# Patient Record
Sex: Female | Born: 1955 | Race: White | Hispanic: No | Marital: Married
Health system: Midwestern US, Community
[De-identification: ages and names within clinical notes are randomized; demographics above are authoritative.]

## PROBLEM LIST (undated history)

## (undated) DIAGNOSIS — K449 Diaphragmatic hernia without obstruction or gangrene: Secondary | ICD-10-CM

## (undated) DIAGNOSIS — N644 Mastodynia: Secondary | ICD-10-CM

## (undated) DIAGNOSIS — M0609 Rheumatoid arthritis without rheumatoid factor, multiple sites: Secondary | ICD-10-CM

## (undated) DIAGNOSIS — M0579 Rheumatoid arthritis with rheumatoid factor of multiple sites without organ or systems involvement: Secondary | ICD-10-CM

## (undated) DIAGNOSIS — C8337 Diffuse large B-cell lymphoma, spleen: Secondary | ICD-10-CM

---

## 2008-11-08 NOTE — ED Provider Notes (Signed)
[facility]                      EMERGENCY DEPARTMENT TREATMENT REPORT   NAME:  [name] [name], [name] [name]                       PT. LOCATION:     ER  [phone]   MR #:         BILLING #: [id number]          DOS: 11/08/2008   TIME: 3:26 P   [id number]   cc:   *Dr. Amie Critchley   PRIMARY CARE PHYSICIAN   Dr. Amie Critchley   EVALUATION TIME   9:06 a.m.   CHIEF COMPLAINT   Hives.   HISTORY OF PRESENT ILLNESS   This is a 53 year old female who states that approximately 45 minutes ago   she was sitting at work when she began to experience an itchy red rash   primarily on her upper extremities, but also beginning on her lower   extremities and torso.  She denies any potential allergen exposure.  Denies   any respiratory difficulties.  Denies any fevers, myalgias, has no other   acute complaints at this time.   REVIEW OF SYSTEMS   CONSTITUTIONAL:  No fevers, no chills.   RESPIRATORY:  No cough, shortness of breath, or wheezing.   MUSCULOSKELETAL:  No joint pain or swelling.   INTEGUMENTARY:  Per HPI.   NEUROLOGICAL:  No headaches, sensory or motor symptoms.   PAST MEDICAL HISTORY   Hyperlipidemia.   PAST SURGICAL HISTORY   Cholecystectomy, hysterectomy, thyroidectomy.   SOCIAL HISTORY   No alcohol, tobacco or drug abuse.   ALLERGIES   ____sulfa and penicillins.   CURRENT MEDICATIONS   Listed and reviewed in [facility].   PHYSICAL EXAMINATION   CONSTITUTIONAL/GENERAL APPEARANCE:  The patient appears well developed and   well nourished.  Appearance and behavior are age and situation appropriate.   VITAL SIGNS:  Blood pressure 143/96, pulse 88, respiratory 20, temperature   98.1, O2 saturation 97% on room air.   INTEGUMENTARY:  The patient presents with multiple urticarial-like wheals   which do blanch to pressure primarily on the upper extremities, but also   beginning to distribute on the lower extremities particularly in the groin   region.  There is no facial involvement. Michele Mcalpine, RN was at bedside    during examination.   RESPIRATORY:  Clear and equal breath sounds.  No respiratory distress,   tachypnea, or accessory muscle use.   CARDIOVASCULAR:  Heart:  Good S1 and S2, regular rate and rhythm.   CONTINUATION BY NICHOLAS BROSKY, PA-C   INITIAL ASSESSMENT AND MANAGEMENT PLAN   This is a 53 year old female who presents with urticaria consistent with   acute allergic reaction with no respiratory difficulties.  At this point,   we will medicate her with Benadryl, Solu-Medrol and Tagamet, and further   evaluate the patient on receipt if these services.   REEVALUATION/COURSE IN ED   The patient had marked improvement of symptoms while in the emergency   department.  At this point, we will discharge the patient home with   supportive care measures and followup care as needed.   CLINICAL IMPRESSION/DIAGNOSIS   Acute urticaria.   DISPOSITION/PLAN   This patient was interviewed, examined, and discussed with Dr. Orma Flaming and it was agreed that the patient could be discharged home in   stable condition.  The patient was given prednisone and Vistaril.  Told to   return to the ER if condition worsens or new symptoms develop.  Follow up   with primary care physician.   Electronically Signed By:   Orma Flaming, M.D. 11/10/2008   10:08   ____________________________   Orma Flaming, M.D.   Dictated by Roderic Scarce, PA-C   My signature above authenticates this document and my orders, the final   diagnosis(es), discharge prescription(s) and instructions in the [facility] record.   GM  D:  11/08/2008  T:  11/09/2008  8:40 A   [id number]

## 2010-03-13 LAB — CBC WITH AUTOMATED DIFF
ABS. BASOPHILS: 0 10*3/uL (ref 0.0–0.06)
ABS. EOSINOPHILS: 0.1 10*3/uL (ref 0.0–0.4)
ABS. LYMPHOCYTES: 1.8 10*3/uL (ref 0.9–3.6)
ABS. MONOCYTES: 0.3 10*3/uL (ref 0.05–1.2)
ABS. NEUTROPHILS: 3.6 10*3/uL (ref 1.8–8.0)
BASOPHILS: 1 % (ref 0–2)
EOSINOPHILS: 1 % (ref 0–5)
HCT: 40.3 % (ref 35.0–45.0)
HGB: 13.8 g/dL (ref 12.0–16.0)
LYMPHOCYTES: 31 % (ref 21–52)
MCH: 31.2 PG (ref 24.0–34.0)
MCHC: 34.2 g/dL (ref 31.0–37.0)
MCV: 91.2 FL (ref 74.0–97.0)
MONOCYTES: 6 % (ref 3–10)
MPV: 12.8 FL — ABNORMAL HIGH (ref 9.2–11.8)
NEUTROPHILS: 61 % (ref 40–73)
PLATELET: 192 10*3/uL (ref 135–420)
RBC: 4.42 M/uL (ref 4.20–5.30)
RDW: 14.2 % (ref 11.6–14.5)
WBC: 5.8 10*3/uL (ref 4.6–13.2)

## 2010-03-13 LAB — METABOLIC PANEL, BASIC
Anion gap: 11 mmol/L (ref 5–15)
BUN/Creatinine ratio: 18 (ref 12–20)
BUN: 16 MG/DL (ref 7–18)
CO2: 26 MMOL/L (ref 21–32)
Calcium: 9 MG/DL (ref 8.4–10.4)
Chloride: 102 MMOL/L (ref 100–108)
Creatinine: 0.9 MG/DL (ref 0.6–1.3)
GFR est AA: >60 mL/min/{1.73_m2} (ref >60)
GFR est non-AA: >60 mL/min/{1.73_m2} (ref >60)
Glucose: 75 MG/DL (ref 74–99)
Potassium: 4.1 MMOL/L (ref 3.5–5.5)
Sodium: 139 MMOL/L (ref 136–145)

## 2010-10-02 NOTE — ED Provider Notes (Signed)
KNOWN ALLERGIES   Codeine Sulfate   Morphine Sulfate: Reaction: Itching   Penicillins       TRIAGE   PATIENT: NAME: [name] [name], AGE: 54, GENDER: female, DOB: Sat         [birth year], TIME OF GREET: Wed Oct 02, 2010 09:08, SSN: [id number],         KG WEIGHT: 113.4 (est.), HEIGHT: 170cm, MEDICAL RECORD NUMBER:         [id number], ACCOUNT NUMBER: [id number], PCP: Cindie Laroche,.   ADMISSION: URGENCY: 2, DEPT: Emergency, BED: WAITING.   VITAL SIGNS: BP 158/99, (Sitting), Pulse 73, Resp 16, Temp 96.6,         (Oral), Pain 10, O2 Sat 98, on Room air, Time 10/02/2010 09:17.   COMPLAINT:  Headache since 1815; B/P 189/112 at home.   PRESENTING COMPLAINT:  pt complains of headache to top of head.         pt states she does not have history of migraines. pt states it         started at approx 1800 yest. she took OTC BC powder, tylenol, and         aleive without relief., Since Yesterday.   PAIN: Patient complains of pain, Pain described as sharp, On a         scale 0-10 patient rates pain as 10, Pain is constant.   TB SCREENING: TB screen negative for this patient.   ABUSE SCREENING: Patient denies physical abuse or threats.   FALL RISK: Fall risk assessment not applicable to this patient.   SUICIDAL IDEATION: Suicidal ideation is not present.   ADVANCE DIRECTIVES: Unknown if patient has advance directives,         Triage assessment performed.   PROVIDERS: TRIAGE NURSE: Gypsy Lore, RN.   PREVIOUS VISIT ALLERGIES: Codeine Sulfate, Penicillins.       CURRENT MEDICATIONS     No recorded medications       MEDICATION SERVICE   DiphenhydrAMINE Hydrochloride:  Order: DiphenhydrAMINE         Hydrochloride (Diphenhydramine Hydrochloride) - Dose: 25 mg         : IV         Ordered by: Nelda Bucks, PA-C         Entered by: Nelda Bucks, PA-C Wed Oct 02, 2010 09:39 ,          Acknowledged by: Kathlen Brunswick, RN Wed Oct 02, 2010 10:20         Documented as given by: Clent Demark, RN Wed Oct 02, 2010 10:40           Patient, Medication, Dose, Route and Time verified prior to         administration.          Time given: 1035, Amount given: 25mg , IV site 1, Medication         administered into right upper arm, IVP, Initial medication, Slowly,         Catheter placement confirmed via flush prior to administration, IV         site without signs or symptoms of infiltration during medication         administration, No swelling during administration, No drainage during         administration, IV flushed after administration, Correct patient,         time, route, dose and medication confirmed prior to administration,         Patient advised of actions and  side-effects prior to administration,         Allergies confirmed and medications reviewed prior to administration,         Patient tolerated procedure well, Patient in position of comfort,         Side rails up, Cart in lowest position, Family at bedside, Call light         in reach.   DiphenhydrAMINE Hydrochloride:  Order: DiphenhydrAMINE         Hydrochloride (Diphenhydramine Hydrochloride) - Dose: 25 mg         : IV         Ordered by: Nelda Bucks, PA-C         Entered by: Nelda Bucks, PA-C Wed Oct 02, 2010 11:11 ,          Acknowledged by: Clent Demark, RN Wed Oct 02, 2010 11:12         Documented as given by: Clent Demark, RN Wed Oct 02, 2010 11:17          Patient, Medication, Dose, Route and Time verified prior to         administration.          Time given: 1117, Amount given: 25mg , IV site 1, Medication         administered into right upper arm, IVP, Subsequent different         medication, Slowly, Catheter placement confirmed via flush prior to         administration, IV site without signs or symptoms of infiltration         during medication administration, No swelling during administration,         No drainage during administration, IV flushed after administration,         Correct patient, time, route, dose and medication confirmed prior to          administration, Patient advised of actions and side-effects prior to         administration, Allergies confirmed and medications reviewed prior to         administration, Patient tolerated procedure well, Patient in position         of comfort, Side rails up, Cart in lowest position, Family at         bedside, Call light in reach.   Fioricet:  Order: Fioricet (Acetaminophen/Butalbital/Caffeine) -         Dose: 1 tab(s) : Oral         Ordered by: Nelda Bucks, PA-C         Entered by: Nelda Bucks, PA-C Wed Oct 02, 2010 15:31 ,          Acknowledged by: Kathlen Brunswick, RN Wed Oct 02, 2010 16:17         Documented as given by: Kathlen Brunswick, RN Wed Oct 02, 2010 16:18          Patient, Medication, Dose, Route and Time verified prior to         administration.          Time given: 1618, Amount given: 1 tab, Site: Medication administered         P.O., Correct patient, time, route, dose and medication confirmed         prior to administration, Patient advised of actions and side-effects         prior to administration, Allergies confirmed and medications reviewed         prior to administration, Patient in position of  comfort, Side rails         up, Cart in lowest position, Family at bedside, Call light in reach.   Haldol:  Order: Haldol (Haloperidol Lactate) - Dose:         2.5 mg : IV         Ordered by: Nelda Bucks, PA-C         Entered by: Nelda Bucks, PA-C Wed Oct 02, 2010 09:39 ,          Acknowledged by: Kathlen Brunswick, RN Wed Oct 02, 2010 10:20         Documented as given by: Clent Demark, RN Wed Oct 02, 2010 10:40          Patient, Medication, Dose, Route and Time verified prior to         administration.          Time given: 1035, Amount given: 2.5mg , IV site 1, Medication         administered into right upper arm, IVP, Initial medication, Slowly,         Catheter placement confirmed via flush prior to administration, IV         site without signs or symptoms of infiltration during medication          administration, No swelling during administration, No drainage during         administration, IV flushed after administration, Correct patient,         time, route, dose and medication confirmed prior to administration,         Patient advised of actions and side-effects prior to administration,         Allergies confirmed and medications reviewed prior to administration,         Patient tolerated procedure well, Patient in position of comfort,         Side rails up, Cart in lowest position, Family at bedside, Call light         in reach.   Nalbuphine Hydrochloride:  Order: Nalbuphine Hydrochloride -         Dose: 5 mg : IV         POTENTIAL ALLERGY REACTION: 'Codeine Sulfate', 'Morphine Sulfate' -         Not a true allergy         Ordered by: Damien Fusi, MD         Entered by: Damien Fusi, MD Wed Oct 02, 2010 13:44          Documented as given by: Clent Demark, RN Wed Oct 02, 2010 13:58          Patient, Medication, Dose, Route and Time verified prior to         administration.          Time given: 1340, Amount given: 5MG , IV site 1, Medication         administered into right upper arm, IVP, Initial medication, Slowly,         Catheter placement confirmed via flush prior to administration, IV         site without signs or symptoms of infiltration during medication         administration, No swelling during administration, No drainage during         administration, IV flushed after administration, Correct patient,         time, route, dose and medication confirmed prior to administration,         Patient advised of  actions and side-effects prior to administration,         Allergies confirmed and medications reviewed prior to administration,         Patient tolerated procedure well, Patient in position of comfort,         Side rails up, Cart in lowest position, Family at bedside, Call light         in reach.   Reglan:  Order: Reglan (Metoclopramide Hydrochloride) -         Dose: 10 mg : IV          POTENTIAL SEVERE INTERACTION: Haldol (Haloperidol Lactate) - Low risk         interaction         Ordered by: Nelda Bucks, PA-C         Entered by: Nelda Bucks, PA-C Wed Oct 02, 2010 11:11 ,          Acknowledged by: Clent Demark, RN Wed Oct 02, 2010 11:12         Documented as given by: Clent Demark, RN Wed Oct 02, 2010 11:18          Patient, Medication, Dose, Route and Time verified prior to         administration.          Time given: 1115, Amount given: 10mg , IV site 1, Medication         administered into right upper arm, IVP, Initial medication, Slowly,         Catheter placement confirmed via flush prior to administration, IV         site without signs or symptoms of infiltration during medication         administration, No swelling during administration, No drainage during         administration, IV flushed after administration, Correct patient,         time, route, dose and medication confirmed prior to administration,         Patient advised of actions and side-effects prior to administration,         Allergies confirmed and medications reviewed prior to administration,         Patient tolerated procedure well, Patient in position of comfort,         Side rails up, Cart in lowest position, Family at bedside, Call light         in reach.   Zofran:  Order: Zofran (Ondansetron Hydrochloride) -         Dose: 4 mg : IV         POTENTIAL SEVERE INTERACTION: Haldol (Haloperidol Lactate) - Not true         allergy         Ordered by: Damien Fusi, MD         Entered by: Damien Fusi, MD Wed Oct 02, 2010 13:09 ,          Acknowledged by: Clent Demark, RN Wed Oct 02, 2010 13:26         Documented as given by: Clent Demark, RN Wed Oct 02, 2010 13:41          Patient, Medication, Dose, Route and Time verified prior to         administration.          Time given: 1340, Amount given: 4MG , IV site 1, Medication         administered into right upper arm, IVP, Initial medication, Slowly,  Catheter placement confirmed via flush prior to administration, IV         site without signs or symptoms of infiltration during medication         administration, No swelling during administration, No drainage during         administration, IV flushed after administration, Correct patient,         time, route, dose and medication confirmed prior to administration,         Patient advised of actions and side-effects prior to administration,         Allergies confirmed and medications reviewed prior to administration,         Patient tolerated procedure well, Patient in position of comfort,         Side rails up, Cart in lowest position, Family at bedside, Call light         in reach.       INSTRUCTION   DISCHARGE:  HEADACHE (CEPHALGIA), ELEVATED BLOOD PRESSURE (HTN,         HYPERTENSION, HIGH BP, ELEVATED BP).   [facility], MEDICINE, [address]         [place], [place] [address], [phone].   SPECIAL:  your blood pressure was elevated today in the emergency         department 158/93 have it rechecked in one week by a primary care         physician         return to ER if symptoms worsen         follow up with primary care physician.   Key:     ALJ=Johnson, PA-C, Adrienne  BMS1=Shortt, RN, Britta Mccreedy  DAP0=Pitrolo, MD,     Onalee Hua     MAK1=Knice, RN, Marcelino Duster

## 2010-10-02 NOTE — ED Provider Notes (Signed)
[facility]   EMERGENCY DEPARTMENT TREATMENT REPORT   NAME:  [name], [name]   SEX:   F   ADMIT: 10/02/2010   DOB:   1956-04-06   MR#    [id number]   ROOM:     TIME SEEN: 01 19 PM   ACCT#  [id number]       cc: Riley Churches MD       CHIEF COMPLAINT:   Headache since 1850,  blood pressure elevated 189/112 at home.       HISTORY OF PRESENT ILLNESS:   A 54 year old female who presents with sudden onset of headache.  She states    that last night while she was driving she had a sudden headache that was a 10    out of 10 to the top of  head and noted that throughout the night, her blood    pressure has been elevated.  She states her blood pressure at its maximum had    been 109/112.  She is taking over-the-counter BC powder, Tylenol and Aleve at    home without relief.  The pain is a 10 out of 10 and constant since yesterday    with no history of trauma.  She denies any urinary or fecal incontinence.     She denies any numbness or tingling anywhere in her person.       REVIEW OF SYSTEMS:   CONSTITUTIONAL:  Denies fever or chills.   EYES:  Denies visual complaints.   ENT:  Denies sore throat.   ENDOCRINE:  Denies increased thirst or urination.   RESPIRATORY:  No cough, shortness of breath, or wheezing.    CARDIOVASCULAR:  Denies chest pain.   GASTROINTESTINAL:  Denies nausea, vomiting.   INTEGUMENTARY:  Denies rashes.   NEUROLOGIC:  As above.  Denies sensory or motor deficits.       PAST MEDICAL HISTORY:   Hyperlipidemia, hysterectomy, thyroidectomy, cholecystectomy.       SOCIAL HISTORY:   Denies tobacco, alcohol or recreational drug use.       FAMILY HISTORY:   Noncontributory.       CURRENT MEDICATIONS:   None.       ALLERGIES:   CODEINE, MORPHINE, PENICILLIN.       PHYSICAL EXAMINATION:   VITAL SIGNS:  Blood pressure 158/99, pulse 73, respirations 16, temperature    96.6, pain 10, O2 sat 98% on room air.   GENERAL:  This is a well-developed, well-nourished, nonacute appearing female     who is in no obvious distress.   HEENT:  Conjunctivae clear.  Hearing is grossly intact to voice.  Eyes:     Extraocular muscles are intact.  No nystagmus.  Ears/Nose:  Hearing is grossly    intact to voice.  Internal and external examinations of the ears and nose are    unremarkable.  Mouth/Throat:  Surfaces of the pharynx, palate, and tongue are    pink, moist, and without lesions.    RESPIRATORY:  Clear and equal breath sounds.  No respiratory distress,    tachypnea, or accessory muscle use.    CARDIOVASCULAR:  Heart regular, without murmurs, gallops, rubs, or thrills.     PMI not displaced.    CHEST:  Chest symmetrical without masses or tenderness.   No peripheral edema or significant varicosities.    GI:  Abdomen soft, nontender, without complaint of pain to palpation.  No    hepatomegaly or splenomegaly.    MUSCULOSKELETAL:  Stance and gait appear  normal.    SKIN:  Warm and dry without rashes.    NEUROLOGIC:  Alert, oriented. Sensation intact, motor strength equal and    symmetric.  No facial asymmetry.  Gross check is 5 out of 5.  The patient has    intact sensation bilateral sides of body and it appears that this feels the    same.  Finger-to-nose testing within normal limits.  Normal reflexes of 2+.       INITIAL ASSESSMENT AND MANAGEMENT PLAN:   This is a 54 year old female who presents with sudden onset headache 1800    hours yesterday evening along with elevated blood pressures.  No history    hypertension.  I will CT the head to rule out any intracranial abnormalities    coupled with possible LP to rule out a subarachnoid bleed with this being the    worst headache.  I will give her IV pain medication.       PROCEDURE NOTE:     After obtaining informed consent under sterile conditions, the area over L3-L4    prepped, anesthetized with plain lidocaine,  spinal needle inserted into disk    space L3-L4, no fluid was obtained.  I did readjust the needle, once again     the needle hub was up to skin with no production of CSF.  Will attempt to do    fluoroscopy guided CSF fluid obtaining.       CONTINUATION BY ADRIENNE Laural Benes, PA-C:       RESULTS OF DIAGNOSTIC STUDIES:   CT read as normal with no acute abnormalities by Dr. Noel Gerold.  CBC, PT, PTT and    BMP within normal limits.  The patient did undergo a lumbar puncture by    myself.  Please see my procedure note; however, was unsuccessful.  She did    follow up with Dr. Scarlette Shorts who did perform successful lumbar    puncture L4-L5 level as described in his note for fluoroscopy guided spine    injection.  Informed consent was obtained, back was marked at L4-L5 level,    skin was prepped with antiseptic, Xylocaine used for anesthesia.  A 22 gauge    needle was inserted and CSF was obtained which was initial slightly bloody but    cleared quickly.  The cerebrospinal fluid was placed in 4 tubes.  The stylet    was returned.  The needle was removed.  The patient tolerated the procedure    well without any immediate problems.  Fluid was sent for appropriate studies,    CSF cell count tube one was 374 red blood cells that did clear by tube 3    which was 5.  There was no increased glucose, protein.  White blood cell count    was 0.       COURSE IN THE EMERGENCY DEPARTMENT:   The patient given IV migraine cocktails followed by Nubain and Zofran and had    improvement of symptoms.       FINAL DIAGNOSIS:   Acute cephalgia.       DISPOSITION:   The patient discharged stable to home.  She is given Fioricet prescription.     She did have elevated blood pressure, was counseled on this and written the    blood pressure at today's visit.  She is to follow up with primary care.     Return to the ED if symptoms persist or worsen.  Above patient was evaluated  by myself and by Dr. Damien Fusi who does agree.           ___________________   Elsie Saas MD   Dictated By: Shireen Quan, PA-C   hp   D:10/02/2010    T: 10/02/2010 13:51:47   [id number]

## 2010-10-11 NOTE — ED Provider Notes (Signed)
KNOWN ALLERGIES   Codeine Sulfate   Morphine Sulfate: Reaction: Itching   Penicillins       TRIAGE   PATIENT: NAME: [name] [name], AGE: 55, GENDER: female, DOB:         Sat [birth year], TIME OF GREET: Fri Oct 11, 2010 10:30, SSN:         [id number], KG WEIGHT: 113.4 (est.), HEIGHT: 170cm, MEDICAL RECORD         NUMBER: [id number], ACCOUNT NUMBER: [id number], PCP: Cindie Laroche,.   ADMISSION: URGENCY: 2, AMBULANCE: [facility] #, DEPT: Emergency,         BED: 2ED 13CP.   VITAL SIGNS: BP 139/104, (Lying), Pulse 74, Resp 16, Temp 97.9,         (Oral), Pain 10, O2 Sat 96, on Room air, Time 10/11/2010 10:46.   COMPLAINT:  Ha - blurry vision HTN.   PRESENTING COMPLAINT:  Constant H/A since 12/28 - intermittently         worse, dizzy when walking the last few day, +light sensitivity, N/V         several times in last week. Intermittent noise sensitivity.   PAIN: Patient complains of pain, Pain described as throbbing, On         a scale 0-10 patient rates pain as 10, pounding, Pain is constant.   IMMUNIZATIONS:  Last tetanus shot received less than 10 years         ago, pnuemonia shot up to date, flu shot up to date.   LMP: LMP: Hysterectomy.   TB SCREENING: TB screen negative for this patient.   ABUSE SCREENING: Patient denies physical abuse or threats.   FALL RISK: Patient has a high risk of falling, Patient has a         history of falling (25), Secondary diagnosis (25), None/bed         rest/nurse assist (0), No IV or IV access (0), Weak (10), Oriented to         own ability (0), Total 60, last afll 3 weeks ago.   SUICIDAL IDEATION: Suicidal ideation is not present.   ADVANCE DIRECTIVES: Patient does not have advance directives,         Triage assessment performed.   PROVIDERS: TRIAGE NURSE: Gypsy Lore, RN.   PREVIOUS VISIT ALLERGIES: Codeine Sulfate, Morphine Sulfate,         Penicillins.       AMBULANCE   AMBULANCE: Ambulance: Fri Oct 11, 2010 09:59.       CURRENT MEDICATIONS    Zofran ODT:  1 tab Oral As needed every four hours. x 4 Mg - Oral         - Q4PRN.   Fioricet:  1-2 tabs Oral See Notes. x 325 Mg-50 Mg-40 Mg - Oral -         SEENOTES.   Synthroid:  150 mcg Oral once a day.   Crestor:  10 mg Oral once a day.   Mirapex:  2 mg Oral once a day.   goodies - last dose this AM       MEDICATION SERVICE   Ativan:  Order: Ativan (Lorazepam) - Dose: 2 mg : Oral         Ordered by: Vinnie Langton, MD         Entered by: Vinnie Langton, MD Caleen Essex Oct 11, 2010 11:49 ,          Acknowledged by: Britta Mccreedy  Lessie Dings, RN Caleen Essex Oct 11, 2010 11:56         Documented as given by: Gypsy Lore, RN Caleen Essex Oct 11, 2010 12:05          Patient, Medication, Dose, Route and Time verified prior to         administration.          Time given: 1207, Amount given: 2mg , Site: Medication administered         P.O., Correct patient, time, route, dose and medication confirmed         prior to administration, Patient advised of actions and side-effects         prior to administration, Allergies confirmed and medications reviewed         prior to administration, Patient in position of comfort, Side rails         up, Cart in lowest position, Family at bedside, Call light in reach.   DiphenhydrAMINE Hydrochloride:  Order: DiphenhydrAMINE         Hydrochloride (Diphenhydramine Hydrochloride) - Dose: 25 mg         : IV         Ordered by: Vinnie Langton, MD         Entered by: Vinnie Langton, MD Caleen Essex Oct 11, 2010 11:49 ,          Acknowledged by: Gypsy Lore, RN Caleen Essex Oct 11, 2010 11:56         Documented as given by: Gypsy Lore, RN Caleen Essex Oct 11, 2010 12:07          Patient, Medication, Dose, Route and Time verified prior to         administration.          Time given: 1204, Amount given: 25mg , IV site 1, Concentration         confirmed prior to administration, IVP, Initial medication, Slowly,         Subsequent different medication, Slowly, Catheter placement confirmed         via flush prior to administration, IV site without signs or symptoms          of infiltration during medication administration, No swelling during         administration, No drainage during administration, IV flushed after         administration, Correct patient, time, route, dose and medication         confirmed prior to administration, Patient advised of actions and         side-effects prior to administration, Allergies confirmed and         medications reviewed prior to administration, Patient in position of         comfort, Side rails up, Cart in lowest position, Family at bedside,         Call light in reach.   Reglan:  Order: Reglan (Metoclopramide Hydrochloride) -         Dose: 10 mg : IV         Ordered by: Vinnie Langton, MD         Entered by: Vinnie Langton, MD Caleen Essex Oct 11, 2010 12:00          Documented as given by: Gypsy Lore, RN Caleen Essex Oct 11, 2010 12:08          Patient, Medication, Dose, Route and Time verified prior to         administration.          Time given:  1207, Amount given: 10mg  in 10 ml over 2 min, IV site 1,         Medication administered into left forearm, Concentration confirmed         prior to administration, IVP, Initial medication, Slowly, Subsequent         different medication, Slowly, Catheter placement confirmed via flush         prior to administration, IV site without signs or symptoms of         infiltration during medication administration, No swelling during         administration, No drainage during administration, IV flushed after         administration, Correct patient, time, route, dose and medication         confirmed prior to administration, Patient advised of actions and         side-effects prior to administration, Allergies confirmed and         medications reviewed prior to administration, Patient in position of         comfort, Side rails up, Cart in lowest position, Family at bedside,         Call light in reach.   (CANCELLED) Prochlorperazine Edisylate:  Order: Prochlorperazine         Edisylate - Dose: 10 mg : IV          Ordered by: Vinnie Langton, MD         Entered by: Vinnie Langton, MD Caleen Essex Oct 11, 2010 11:49 ,          Acknowledged by: Gypsy Lore, RN Caleen Essex Oct 11, 2010 11:56         Cancelled by: Vinnie Langton, MD. Caleen Essex Oct 11, 2010 11:59          Cancel reason: Change in medication plan.       INSTRUCTION   DISCHARGE:  HEADACHE, CLUSTER.   FOLLOWUPMeridee Score, [address],         [place] [address], [phone].   SPECIAL:  Follow up with neurologist Dr. Rosana Hoes.         Return to ER for any worsening or new concerns.         Do not drive or operate machinery while medicated with Vicodin and/or         Reglan.   Key:     [facility]=Conrad, PA-C, Angela  BMS1=Shortt, RN, Britta Mccreedy  CNL2=Lamb, RN,     Delight Ovens, MD, Rolm Gala

## 2010-10-11 NOTE — ED Provider Notes (Signed)
KNOWN ALLERGIES   Codeine Sulfate   Morphine Sulfate: Reaction: Itching   Penicillins       TRIAGE Caleen Essex Oct 11, 2010 10:48 BMS1)   PATIENT: NAME: [name] [name], AGE: 55, GENDER: female, DOB:         Sat 1956/08/10, TIME OF GREET: Fri Oct 11, 2010 10:30, [place]:         [id number], KG WEIGHT: 113.4 (est.), HEIGHT: 170cm, MEDICAL RECORD         NUMBER: [id number], ACCOUNT NUMBER: [id number], PCP: Cindie Laroche,. Caleen Essex Oct 11, 2010 10:48 BMS1)   ADMISSION: URGENCY: 2, AMBULANCE: [facility] #, DEPT: Emergency,         BED: 2ED 13CP. Caleen Essex Oct 11, 2010 10:48 BMS1)   VITAL SIGNS: BP 139/104, (Lying), Pulse 74, Resp 16, Temp 97.9,         (Oral), Pain 10, O2 Sat 96, on Room air, Time 10/11/2010 10:46. (10:46         BMS1)   COMPLAINT:  Ha - blurry vision HTN. (Fri Oct 11, 2010 10:48         BMS1)   PRESENTING COMPLAINT:  Constant H/A since 12/28 - intermittently         worse, dizzy when walking the last few day, +light sensitivity, N/V         several times in last week. Intermittent noise sensitivity. (10:57         BMS1)   PAIN: Patient complains of pain, Pain described as throbbing, On         a scale 0-10 patient rates pain as 10, pounding, Pain is constant.         (10:57 BMS1)   IMMUNIZATIONS:  Last tetanus shot received less than 10 years         ago, pnuemonia shot up to date, flu shot up to date. (10:57         BMS1)   LMP: LMP: Hysterectomy. (10:57 BMS1)   TB SCREENING: TB screen negative for this patient. (10:57         BMS1)   ABUSE SCREENING: Patient denies physical abuse or threats. (10:57         BMS1)   FALL RISK: Patient has a high risk of falling, Patient has a         history of falling (25), Secondary diagnosis (25), None/bed         rest/nurse assist (0), No IV or IV access (0), Weak (10), Oriented to         own ability (0), Total 60, last afll 3 weeks ago. (10:57 BMS1)   SUICIDAL IDEATION: Suicidal ideation is not present. (10:57         BMS1)    ADVANCE DIRECTIVES: Patient does not have advance directives,         Triage assessment performed. (10:57 BMS1)   PROVIDERS: TRIAGE NURSE: Gypsy Lore, RN. Caleen Essex Oct 11, 2010         10:48 BMS1)   PREVIOUS VISIT ALLERGIES: Codeine Sulfate, Morphine Sulfate,         Penicillins. Caleen Essex Oct 11, 2010 10:48 BMS1)       AMBULANCE (09:59 CNL2)   AMBULANCE: Ambulance: Caleen Essex Oct 11, 2010 09:59.       CURRENT MEDICATIONS   Zofran ODT:  1 tab Oral As needed every four hours. x 4 Mg - Oral         -  Q4PRN. (10:48 BMS1)   Fioricet:  1-2 tabs Oral See Notes. x 325 Mg-50 Mg-40 Mg - Oral -         SEENOTES. (10:48 BMS1)   Synthroid:  150 mcg Oral once a day. (10:48 BMS1)   Crestor:  10 mg Oral once a day. (10:48 BMS1)   Mirapex:  2 mg Oral once a day. (10:49 BMS1)   goodies - last dose this AM (11:07 BMS1)       MEDICATION SERVICE   Ativan:  Order: Ativan (Lorazepam) - Dose: 2 mg : Oral         Ordered by: Vinnie Langton, MD         Entered by: Vinnie Langton, MD Caleen Essex Oct 11, 2010 11:49 ,          Acknowledged by: Gypsy Lore, RN Caleen Essex Oct 11, 2010 11:56         Documented as given by: Gypsy Lore, RN Caleen Essex Oct 11, 2010 12:05          Patient, Medication, Dose, Route and Time verified prior to         administration.          Time given: 1207, Amount given: 2mg , Site: Medication administered         P.O., Correct patient, time, route, dose and medication confirmed         prior to administration, Patient advised of actions and side-effects         prior to administration, Allergies confirmed and medications reviewed         prior to administration, Patient in position of comfort, Side rails         up, Cart in lowest position, Family at bedside, Call light in reach.   DiphenhydrAMINE Hydrochloride:  Order: DiphenhydrAMINE         Hydrochloride (Diphenhydramine Hydrochloride) - Dose: 25 mg         : IV         Ordered by: Vinnie Langton, MD         Entered by: Vinnie Langton, MD Caleen Essex Oct 11, 2010 11:49 ,           Acknowledged by: Gypsy Lore, RN Caleen Essex Oct 11, 2010 11:56         Documented as given by: Gypsy Lore, RN Caleen Essex Oct 11, 2010 12:07          Patient, Medication, Dose, Route and Time verified prior to         administration.          Time given: 1204, Amount given: 25mg , IV site 1, Concentration         confirmed prior to administration, IVP, Initial medication, Slowly,         Subsequent different medication, Slowly, Catheter placement confirmed         via flush prior to administration, IV site without signs or symptoms         of infiltration during medication administration, No swelling during         administration, No drainage during administration, IV flushed after         administration, Correct patient, time, route, dose and medication         confirmed prior to administration, Patient advised of actions and         side-effects prior to administration, Allergies confirmed and         medications reviewed prior to administration, Patient in position of  comfort, Side rails up, Cart in lowest position, Family at bedside,         Call light in reach.   Reglan:  Order: Reglan (Metoclopramide Hydrochloride) -         Dose: 10 mg : IV         Ordered by: Vinnie Langton, MD         Entered by: Vinnie Langton, MD Caleen Essex Oct 11, 2010 12:00          Documented as given by: Gypsy Lore, RN Caleen Essex Oct 11, 2010 12:08          Patient, Medication, Dose, Route and Time verified prior to         administration.          Time given: 1207, Amount given: 10mg  in 10 ml over 2 min, IV site 1,         Medication administered into left forearm, Concentration confirmed         prior to administration, IVP, Initial medication, Slowly, Subsequent         different medication, Slowly, Catheter placement confirmed via flush         prior to administration, IV site without signs or symptoms of         infiltration during medication administration, No swelling during          administration, No drainage during administration, IV flushed after         administration, Correct patient, time, route, dose and medication         confirmed prior to administration, Patient advised of actions and         side-effects prior to administration, Allergies confirmed and         medications reviewed prior to administration, Patient in position of         comfort, Side rails up, Cart in lowest position, Family at bedside,         Call light in reach.   (CANCELLED) Prochlorperazine Edisylate:  Order: Prochlorperazine         Edisylate - Dose: 10 mg : IV         Ordered by: Vinnie Langton, MD         Entered by: Vinnie Langton, MD Caleen Essex Oct 11, 2010 11:49 ,          Acknowledged by: Gypsy Lore, RN Caleen Essex Oct 11, 2010 11:56         Cancelled by: Vinnie Langton, MD. Caleen Essex Oct 11, 2010 11:59          Cancel reason: Change in medication plan.       ORDERS   PICC Team for IV placement and labs:  Ordered for: Arvella Merles, MD, Rolm Gala         Status: Done by Sharolyn Douglas Fri Oct 11, 2010 11:04. (11:03         BMS1)   O2 NRB Mask:  Ordered for: Arvella Merles, MD, Rolm Gala         Status: Done by Pernell Dupre, PM, Delice Bison Oct 11, 2010 11:14. (11:11         [facility])   Visual Acuity Exam:  Ordered for: Arvella Merles, MD, Rolm Gala         Status: Done by Lessie Dings, RN, Boykin Peek Oct 11, 2010 11:49. (11:22         [facility])   IV- Normal Saline 1 liter Bolus:  Ordered for: Arvella Merles, MD, Rolm Gala  Status: Cancelled by Arvella Merles, MD, Lonia Farber Oct 11, 2010 11:33. (11:32         ZOX0)   PO Fluid [place] 32 OZ:  Ordered for: Arvella Merles, MD, Rolm Gala         Status: Done by Pernell Dupre, PM, Delice Bison Oct 11, 2010 13:08. (11:34         EHK1)   BP Cuff Adult Regular:  Ordered for: Arvella Merles, MD, Rolm Gala         Status: Active. (13:35 BMS1)   Elita Boone IV Cath:  Ordered for: Arvella Merles, MD, Rolm Gala         Status: Active. (13:35 BMS1)   Non-Rebreather Adult:  Ordered for: Arvella Merles, MD, Rolm Gala         Status: Active. (13:35 BMS1)   Elita Boone IV Cath:  Ordered for: Arvella Merles, MD, Rolm Gala         Status: Active. (13:35 BMS1)    Nasal Cannula:  Ordered for: Arvella Merles, MD, Rolm Gala         Status: Active. (13:35 BMS1)   Elita Boone IV Cath:  Ordered for: Arvella Merles, MD, Rolm Gala         Status: Active. (13:35 BMS1)   ER OXYGEN THERAPY:  Ordered for: Arvella Merles, MD, Rolm Gala         Status: Active. (13:35 BMS1)   Dial A Flow Tubing:  Ordered for: Arvella Merles, MD, Rolm Gala         Status: Active. (13:35 BMS1)   Elita Boone IV Cath:  Ordered for: Arvella Merles, MD, Rolm Gala         Status: Active. (13:35 BMS1)      Ordered for: Arvella Merles, MD, Rolm Gala         Status: Active. (13:35 BMS1)   CONTINUOUS PULSE OX:  Ordered for: Arvella Merles, MD, Rolm Gala         Status: Active. (13:35 BMS1)   IV SET-UP:  Ordered for: Arvella Merles, MD, Rolm Gala         Status: Active. (13:35 BMS1)       NURSING ASSESSMENT: HEADACHE (10:57 BMS1)   CONSTITUTIONAL: Patient arrives ambulatory, Gait steady, Patient         appears comfortable, Patient cooperative, Patient alert, Oriented to         person, place and time, Skin warm, Skin dry, Skin normal in color,         Mucous membranes pink, Mucous membranes moist, Patient is         well-groomed, Patient complains of Constant H/A since 12/28 -         intermittently worse, dizzy when walking the last few day, +light         sensitivity, N/V several times in last week. Intermittent noise         sensitivity.   PAIN: on a scale 0-10 patient rates pain as 10.   HEADACHE: history of migraines, no associated aura, Associated         with difficulty concentrating, not associated with diplopia,         Associated with nausea, Associated with vomiting, Associated with         photophobia, Associated with phonophobia, Notes: 1st ever headache         last week - Seen here - CTS and LP's x 2 done for eval. Blurry         vision.   NEURO: Neuro assessment findings include onset of symptoms: last         week, Pupils equally round and reactive to light, Left  pupil 3 mm in         size, Right pupil 3 mm in size, Able to close eyes, Face symmetrical,         Speech normal, no ptosis, no nystagmus, Visual changes described  as,         blurred vision, no facial droop, no facial numbness, no swelling, no         paresthesias, GCS:, Eye opening: (4) - Spontaneous, Verbal: (5) -         Oriented/conversive, Motor: (6) - Obeys commands/Spontaneous, Hand         grasps equal, Upper extremity strength strong, no numbness to upper         extremities, Lower extremity strength strong, Foot press equal, no         numbness to lower extremities, Associated with dizziness described         as, feeling unsteady, no associated fever, no associated memory loss,         no associated loss of consciousness, Associated with motor ability         changes:, falling a lot, no associated neck stiffness, Associated         with nausea, no associated alterations in sensation, no associated         personality changes, no associated posturing, no associated seizures,         no associated syncopal episode, Associated with vomiting, history,         Number of times: several times last week, no associated weakness.   ENT: Ear assessment findings include ear normal to inspection,         Nasal assessment findings include nose normal to inspection, Mouth         and throat assessment findings include mouth inspection normal, no         associated headache.       NURSING PROCEDURE: DISCHARGE NOTE (13:28 BMS1)   DISCHARGE: Patient discharged to home, ambulating without         assistance, family driving, accompanied by husband/wife/partner,         Discharge instructions given to patient, IV discontinued at 1330, IV         Fluids started at 1115, IV Fluids discontinued at 1330, Prescriptions         given and instructions on side effects given, Name of prescription(s)         given: Reglan, vicodin, Above person(s) verbalized understanding of         discharge instructions and follow-up care, Patient treated and         evaluated by physician.   BELONGINGS: Belongings and valuables with patient at time of          discharge include:, Belongings remain with patient, Valuables remain         with patient.   VITAL SIGNS: BP: 142, / 99, BP: (Sitting), Pulse: 60, Resp: 16,         Pain: 1-2, O2 sat: 100, on Room air.       NURSING PROCEDURE: EYE CARE (11:49 BMS1)   PATIENT IDENTIFIER: Patient's identity verified by patient         stating name, Patient's identity verified by patient stating birth         date, Patient's identity verified by hospital ID bracelet.   EYE CARE: Eye care indicated for visual changes.   FOLLOW-UP: After procedure, visual acuity, left eye: 20/200,  right eye: 20/200, both eyes: 20/200, pt wears glasses - not w/ pt at         this time.       NURSING PROCEDURE: IV (11:15 BMS1)   PATIENT IDENITIFIER: Patient's identity verified by patient         stating name, Patient's identity verified by patient stating birth         date, Patient's identity verified by hospital ID bracelet.   IV SITE 1: IV established, to the left forearm, using a 20 gauge         catheter, in five attempts, Saline lock established, Flushed with         normal saline (mls): 10, Labs drawn at time of placement, Specimens         labeled in the presence of the patient, Tourniquet removed from         patient after procedure., Notes: 2 sticks by B. Shortt, 2 sticks by         Kathlene November; 1 by PICC team.   FOLLOW-UP SITE 1: After procedure, sterile transparent dressing         applied, After procedure, IV line connections checked and properly         labeled, After procedure, no drainage at IV site, After procedure, no         swelling at IV site, After procedure, no redness at IV site, IV         Fluids started at 1115.   FLUIDS: 0.9 normal saline 1 liter hung, first bag, IV bolus of         1000 ml established, at a rate of 1000 ml per hour, via Dial-a-flow         tubing.       NURSING PROCEDURE: NURSE NOTES (11:17 BMS1)   NURSES NOTES: Notes: O2 placed - NRB.       DIAGNOSIS (13:25 EHK1)    FINAL: PRIMARY: Cluster headache, ADDITIONAL: Dehydration         (hypovolemia).       DISPOSITION   PATIENT:  Disposition Type: Discharged, Disposition: Discharged,         Disposition Transport: Family/Friend drive, Condition: Stable. (13:25         EHK1)      IV Infusion: Start and stop time entered for IV fluid, Patient left the         department. (13:34 BMS1)       INSTRUCTION (13:02 AMC)   DISCHARGE:  HEADACHE, CLUSTER.   FOLLOWUPMeridee Score, [address],         [place] [address], [phone].   SPECIAL:  Follow up with neurologist Dr. Rosana Hoes.         Return to ER for any worsening or new concerns.         Do not drive or operate machinery while medicated with Vicodin and/or         Reglan.   Key:     [facility]=Conrad, PA-C, Angela  BMS1=Shortt, RN, Britta Mccreedy  CNL2=Lamb, RN,     Delight Ovens, MD, Rolm Gala

## 2010-10-11 NOTE — ED Provider Notes (Signed)
[facility]   EMERGENCY DEPARTMENT TREATMENT REPORT   NAME:  MIKESKA, Brynlynn   SEX:   F   ADMIT: 10/11/2010   DOB:   1955-12-18   MR#    [id number]   ROOM:     TIME SEEN: 11 46 AM   ACCT#  [id number]       cc: Riley Churches MD       PRIMARY CARE PHYSICIAN:   Dr. Eliezer Champagne       CHIEF COMPLAINT:   Headache, blurred vision, hypertension.       HISTORY OF PRESENT ILLNESS:   This is a 55 year old female who states that she was seen here a little over a    week ago for similar symptoms and had a CAT scan and lumbar puncture and was    discharged home with a diagnosis of acute cephalgia and discharged with    Fioricet and Zofran.  She states the medications did not really help her.  She    saw Dr. Larrie Kass partner and was prescribed Imdur and Imitrex.  She    states that Imitrex made her feel even worse.  She takes Goody powders at home    which seem to dull her symptoms.  She has had a persistent headache which she    describes as a top of her head pounding sensation.  It lasts anywhere from 15    minutes at a time to hours at a time and usually resolves on its own.  She    does have some photophobia with it.  This morning, she developed some blurred    vision which is bilateral.  She does wear glasses but it seems like her    vision is worse.  She also feels dizzy.  She  states that when she sits up or    stands up, she just feels "nauseous" and that is what she describes as    dizziness.  She has had some nausea and occasional vomiting over the last    couple of days.  After discharge from the ER, she never had relief but just    intermittent pain.  She states the headaches are gradual and come and go.     Today, her headache has been more constant.  As I am evaluating her, her blood    pressure seemed to come down to normal, and she now states her pain is 4 to 5    out of 10 and improving on its own, although she did have some Goody powder      a couple of hours ago at home.  The patient states that she was diagnosed    with cluster headaches by her primary care's associate and was given Imitrex    and Imdur which have not helped.         REVIEW OF SYSTEMS:   CONSTITUTIONAL:  The patient denies any fever or chills.     EYES:  Positive for photophobia.   ENT:  The patient denies any congestion or URI symptoms.   RESPIRATORY:  No cough.   CARDIOVASCULAR:  Denies chest pain.   GASTROINTESTINAL:  Nausea and vomiting intermittently.   NEUROLOGIC:  Positive for headache.   MUSCULOSKELETAL:  No joint pain or swelling.    INTEGUMENTARY:  No rashes.    PSYCHIATRIC:  No suicidal or homicidal ideation.        MEDICATIONS:   Zofran, Fioricet, Synthroid, Crestor, Mirapex,  Goody powders, Imitrex and  Imdur.       ALLERGIES:   CODEINE, MORPHINE, PENICILLIN.       PAST MEDICAL HISTORY:   Diabetes, diet controlled, hypothyroidism, cluster headaches, hyperlipidemia,    restless leg syndrome.       SOCIAL HISTORY:   One to 2 drinks of alcohol a week.  Nonsmoker.       FAMILY HISTORY:   Noncontributory.       PHYSICAL EXAMINATION:    VITAL SIGNS:  Blood pressure 124/79, pulse 72, respirations 18, temperature    97.9, pulse oximetry 96% on room air and pain at presentation was 10 out of    10.  As I talked to her, she started to improve and it is now 4 to 5 out of    10.   HEENT:  Eyes are nonicteric.  Pupils are equal, round and reactive to light    bilaterally. ENT:  Mucous membranes moist and pink.     RESPIRATORY:  Clear and equal breath sounds.  No respiratory distress,    tachypnea, or accessory muscle use.    CARDIOVASCULAR:  Heart regular, without murmurs, gallops, rubs, or thrills.     PMI not displaced.    GI:  Abdomen soft, nontender, without complaint of pain to palpation.  No    hepatomegaly or splenomegaly.      MUSCULOSKELETAL:  Nails:  No clubbing or deformities.  Nail beds pink with    prompt capillary refill.    SKIN:  Warm and dry without rashes.     PSYCHIATRIC:  Oriented to time, place and person.  Mood and affect    appropriate.    NEUROLOGICAL:  The patient has 5 out of 5 strength to all extremities.  No    facial asymmetry or slurred speech. Cerebellar testing, finger-to-nose is    unremarkable. He does have noted photophobia on exam.       INITIAL ASSESSMENT:   A 55 year old with complaints of persistent headache for the past 9 days.  The    headache is constant since this morning but has been intermittent, lasting    minutes to hours.       DIAGNOSIS:   Cluster headaches.       Out of medications at home, Goody powder has been the only thing that has    dulled her symptoms.  We will go ahead and try 100% oxygen by nonrebreather    mask and see if this improves her symptoms that seem more consistent with    cluster headache.  If that does not improve, we will medicate her.       CONTINUATION BY ANGELA CONRAD, PA-C:         EMERGENCY DEPARTMENT COURSE:         The patient was given initially O2 on a nonrebreather, which did not help her    symptoms.  She was then given oral Ativan IV, Benadryl and Reglan and at    recheck, she did improve and said her pain was coming down, it was 4 out of 10    and she agrees on the plan with discharged, follow up with neuro and Reglan    as discussed with Dr. Arvella Merles.       DIAGNOSES:   1.  Acute cephalgia, cluster headache,    2.  Dehydration.       DISPOSITION AND PLAN:      The patient discharged with followup with Dr. Rosana Hoes.  Return for worsening or  new concerns.  Given Reglan and Vicodin for breakthrough headaches.  The    patient was personally evaluated by myself and Dr. Vinnie Langton who agrees with    the above assessment and plan.           ___________________   Smitty Cords MD   Dictated By: Wynona Luna. Renata Caprice, PA-C   lo   D:10/11/2010   T: 10/11/2010 12:35:23   [id number]

## 2010-10-16 NOTE — Procedures (Signed)
[facility]   Electroencephalogram   NAME:  [name], [name]    DATE: 10/16/2010   EEG#:    DOB: 11-Jul-1956   MR#    [id number]   ROOM:  CATH   ACCT#  [id number]   SEX: F   REFERRING PHYSICIAN: NADER Antionette Char           cc: Riley Churches MD       CLINICAL HISTORY:   This is a 55 year old female who has been having bad headache episodes for the    last 2 weeks.       This is a 16-channel EEG performed on a 55 year old female who was described    as awake, drowsy and asleep during the recording following her lumbar puncture    procedures.       Background during wakefulness is characterized by 8.5 to 9 Hz posterior    dominant rhythm that is reactive to eye opening and closing over both    occipital regions.  Low voltage beta activity is present anteriorly over both    frontal regions, sometimes superimposed by excessive muscle artifact and    electrode artifact.       Background during drowsiness is characterized by attenuation of the posterior    dominant rhythm and increase in theta and delta frequency over both    hemispheres.  Intermittent occipital delta slowing is present over both    occipital regions without any underlying epileptiform discharge.       The background during deep sleep is characterized by high amplitude delta    frequency.       Photic stimulation does not change the record.       EKG showed normal sinus rhythm at 72 per minute.       EEG CLASSIFICATION:   Normal.       CLINICAL INTERPRETATION:   This is a normal awake, drowsy and sleep EEG.  There were no epileptiform    discharges or any lateralization.           ___________________   Luna Kitchens MD   Dictated By: .    hp   D:10/17/2010   T: 10/17/2010 13:17:28   [id number]

## 2010-10-16 NOTE — Procedures (Signed)
[facility]   Electroencephalogram   NAME:  MIKESKA, Annabell    DATE: 10/16/2010   EEG#:    DOB: 06/30/1956   MR#    [id number]   ROOM:  CATH   ACCT#  [id number]   SEX: F   REFERRING PHYSICIAN: NADER G ATALLA           cc: Mohammad Abolhassani MD       CLINICAL HISTORY:   This is a 54-year-old female who has been having bad headache episodes for the    last 2 weeks.       This is a 16-channel EEG performed on a 54-year-old female who was described    as awake, drowsy and asleep during the recording following her lumbar puncture    procedures.       Background during wakefulness is characterized by 8.5 to 9 Hz posterior    dominant rhythm that is reactive to eye opening and closing over both    occipital regions.  Low voltage beta activity is present anteriorly over both    frontal regions, sometimes superimposed by excessive muscle artifact and    electrode artifact.       Background during drowsiness is characterized by attenuation of the posterior    dominant rhythm and increase in theta and delta frequency over both    hemispheres.  Intermittent occipital delta slowing is present over both    occipital regions without any underlying epileptiform discharge.       The background during deep sleep is characterized by high amplitude delta    frequency.       Photic stimulation does not change the record.       EKG showed normal sinus rhythm at 72 per minute.       EEG CLASSIFICATION:   Normal.       CLINICAL INTERPRETATION:   This is a normal awake, drowsy and sleep EEG.  There were no epileptiform    discharges or any lateralization.           ___________________   Nader G Atalla MD   Dictated By: .    hp   D:10/17/2010   T: 10/17/2010 13:17:28   [id number]

## 2010-10-20 NOTE — Consults (Signed)
[facility]   CONSULTATION REPORT   NAME:  [name] [name], [name] [name]   SEX:   F   ADMIT: 10/20/2010   DATE OF CONSULT: 10/21/2010   REFERRING PHYSICIAN: MOHAMMAD R ABOLHASSANI   DOB: 1956/03/11   MR#    [id number]   ROOM:     ACCT#  [id number]               REASON FOR CONSULTATION:   Headache, imbalance, as well as ataxia.       HISTORY OF PRESENT ILLNESS:   This is a 55 year old very pleasant female with a history of diabetes mellitus    on diet control, hypothyroidism, thyroid cancer, who was recently diagnosed    with pseudotumor cerebri and currently on Diamox 500 mg 4 times a day.       For her recent episode of headache, she had a lumbar tap done which revealed    CSF opening pressure is 22 cm of water.  Her MRI of her head revealed no acute    intracranial pathological lesions.  Her MRA and MRV revealed no underlying    vascular abnormality or cerebral venous thrombosis.  For her symptomatology,    she was started on Diamox 500 mg 4 times a day, but according to her she has    been having bad headache episodes with photophobia, as well as off balance    problem for the last 2 days.  She was brought to emergency room and she was    given Reglan 10 mg IV, Valium 10 mg orally as well as Zofran 4 mg orally.       This morning she is still claiming she has bad headache episodes with    photophobia without any double vision, but she is still claiming she has    blurring of vision without any speech or swallowing problem.  She denies any    dizziness episodes or any vertigo.       REVIEW OF SYSTEMS:   She claims that she lost 14 pounds over the last 3 days.  Also, she claims    that she has insomnia, ankle swelling, easy bruising, nausea without vomiting.     She denies any chest pain, palpitations, difficulty breathing or ankle    swelling.  She denies any back pain, neck pain or muscle ache.       PAST MEDICAL HISTORY:   Diabetes mellitus on diet control, hypothyroidism, thyroid cancer, pseudomotor     cerebri, hyperlipidemia, restless leg syndrome.       PAST SURGICAL HISTORY:   Appendectomy, tonsillectomy, cholecystectomy, hysterectomy, thyroidectomy as    well as left knee surgery.       SOCIAL HISTORY:   She is married.  She does smoke cigarettes and drinks alcohol occasionally.       ALLERGIES:   CODEINE SULFATE, MORPHINE SULFATE WITH ITCHING, PENICILLIN.       MEDICATIONS:   She is currently on Diamox 500 mg 4 times a day, Crestor 10 mg once a day,    Vicodin 1 to 2 tablets q.4-6 hours, Mirapex 2 mg once a day, Synthroid 150 mcg    once a day, Reglan 1 tablet 4 times a day, Zofran 1 tablet every 4 hours.       PHYSICAL EXAMINATION:   VITAL SIGNS:  Blood pressure 110/77,  pulse 84 per minute.   NECK:  Supple.  No neck rigidity or any stiffness.  No carotid bruit    bilaterally.  HEART:  Normal heart sounds without underlying murmur.   LUNGS:  Good air entry bilaterally without any wheezing or rhonchi.   LOWER EXTREMITIES:  No pitting edema or varicose veins.   SKIN:  No skin rash or abnormal skin pigmentation.       NEUROLOGICAL EXAMINATION:   MENTAL STATUS:  She is awake, oriented times 3.  She has severe photophobia.     Speech intact for naming, repetition and comprehension.  No paraphasic error.     No right or left confusion.  No receptive or any expressive aphasia.       CRANIAL NERVES:  Pupils are equal and reactive bilaterally.  Eye movements    intact in all directions.  No nystagmus.  Visual fields demonstrate    questionable bilateral constricted visual fields.  Color saturation intact    bilaterally.  Funduscopy revealed bilateral disc swelling.  Face symmetrical.     Facial sensation intact for pin touch and temperature.  Tongue midline, uvula    midline.  Sternocleidomastoid and trapezoid muscles 5/5 with and without    resistance bilaterally.       MOTOR:  No pronator drift, 5/5 all extremities.  Deep tendon reflexes 2+    symmetrical.  Plantar toes going down bilaterally.        SENSORY:  Intact for pin touch and pressure to her upper and lower extremities    bilaterally.       COORDINATION:  Finger-to-nose test intact bilaterally.  Gait is normal.     Romberg is negative.       LABORATORY DATA:   Sodium 138, potassium 3.9, chloride 107, glucose 118, BUN 18, creatinine 1.       ASSESSMENT:   Bad headache episodes which are usually associated with photophobia as well as    off balance.  Her symptomatology is consistent with pseudotumor cerebri.       RECOMMENDATIONS:   I have advised her to take Diamox 500 mg 5 times a day as well as I will start    her on Topamax 25 mg twice a day.       Thank you for letting me participate in the care of your patient.           ___________________   Luna Kitchens MD   Dictated By:.    jj   D:10/21/2010   T: 10/21/2010 10:26:32   [id number]

## 2010-10-20 NOTE — ED Provider Notes (Signed)
[facility]   EMERGENCY DEPARTMENT TREATMENT REPORT   NAME:  [name] [name], [name] [name]   SEX:   F   ADMIT: 10/20/2010   DOB:   [birth year]   MR#    [id number]   ROOM:     TIME SEEN: 11 18 PM   ACCT#  [id number]       cc: Riley Churches MD       PRIMARY CARE PHYSICIAN:   Dr. Eliezer Champagne         TIME OF EVALUATION:   2030       CHIEF COMPLAINT:   Migraine.       HISTORY OF PRESENT ILLNESS:   This is a 55 year old female presenting for evaluation of migraine.  This is    the third time the patient had presented here to the ED in the past 2-1/2    weeks with the same symptoms.  The patient tells me she has followed up with    neurologist, Dr. Madelyn Flavors, who on 10/17/2010, diagnosed her with pseudotumor    cerebri, prescribed her Diamox and Percocet, and scheduled follow up with her    on 10/28/2010.  The patient states she has undergone several diagnostic    studies to include MRIs, MRAs, CT, LPs  and EEGs.  She reported zero relief of    her pain with recently prescribed medications, states she has been    experiencing the exact same headache pain since 10/02/2010.  Described as a    constant pain located on the top of her head, described as a throbbing    sensation.  She also feels as though her left eye is bulging and notes left    eye to have blurry vision.  She also been very nauseated, has had 4 episodes    of emesis today.  She also reports some gait instability, feels unsteady on    her feet.  She reports worsening photophobia, denies any fevers, abdominal    pain, chest pain or shortness of breath.  Also, denies any one left-sided    weakness.       REVIEW OF SYSTEMS:   CONSTITUTIONAL:  No fevers.   EYES:  Blurry vision, left eye pressure.   ENT:  No sore throat, runny nose, or other URI symptoms.     RESPIRATORY:  No cough, shortness of breath, or wheezing.   CARDIOVASCULAR:  No chest pain, chest pressure, or palpitations.   GASTROINTESTINAL:  Nausea and vomiting.    GENITOURINARY:  No dysuria, frequency, or urgency.    MUSCULOSKELETAL:  No joint pain or swelling.    NEUROLOGICAL:  Headache, gait ataxia.       PAST MEDICAL HISTORY:   Diabetes, diet controlled, hypothyroidism, cluster headaches, hyperlipidemia,    restless leg syndrome.       FAMILY HISTORY:   Noncontributory.       SOCIAL HISTORY:   Nonsmoker, occasional alcohol consumption.       MEDICATIONS:   Reviewed in [place].       ALLERGIES:   CODEINE, MORPHINE, PENICILLINS.       PHYSICAL EXAMINATION:   VITAL SIGNS:  Blood pressure 108/77, pulse 83, respirations 14, temperature    97.1, O2 saturation 97% on room air.   GENERAL APPEARANCE:  The patient appears well developed and well nourished.     She is lying in bed with the coat over her face, due to her photophobia.  She    appears quite miserable due to her pain.  HEENT:  Head normocephalic, atraumatic.  Eyes:  Conjunctivae are clear.  Lids    are normal.  Pupils are asymmetric.  Left pupil appears 5 mm in diameter with    right at 4 mm, normally reactive.  Ears/Nose:  Hearing is grossly intact to    voice.  Internal and external examinations of the ears are unremarkable.     Mouth/Throat:  Surfaces of the pharynx, palate, and tongue are pink, moist,    and without lesions.     NECK:  Supple, nontender, symmetrical, no masses or JVD, trachea midline.     Thyroid not enlarged, nodular, or tender.     LYMPHATICS:  No cervical or submandibular lymphadenopathy palpated.   RESPIRATORY:  Lungs are clear to auscultation bilaterally.  No wheezes, rales    or rhonchi.  The patient is in no acute respiratory distress.   CARDIOVASCULAR:  Heart regular rate and rhythm, no murmurs, rubs or gallops.     No peripheral edema or significant varicosities.    GASTROINTESTINAL:   Abdomen soft, nontender, without complaint of pain to    palpation.  No hepatomegaly or splenomegaly.     MUSCULOSKELETAL: Stance and gait appear normal.  The patient is moving all     extremities appropriately and without difficulty.   SKIN:  Warm and dry without rashes.  Multiple bruises noted to upper    extremities bilaterally.   NEUROLOGIC:  Cranial nerves II-XII intact.   PSYCHIATRIC:  Oriented to time, place and person.  Mood and affect    appropriate.        CONTINUATION BY DR. [facility]:       The patient was seen on 10/20/2010.  The patient's chief complaint is    continuing headaches despite recent additional medications by Dr. Madelyn Flavors, her    neurologist.  On assessment, the patient is in excruciating amount of pain    and seems to be very uncomfortable.  Her case was discussed with Dr. Madelyn Flavors    who although he does not have admission privileges would like to see the    patient here in the morning to re-evaluate her.  The patient was given    medications for pain and for nausea.  She had a significant improvement with    these medicines.       CLINICAL IMPRESSION:   1. Pseudotumor cerebri.   2. Intractable headache.   3. Nausea.       DISPOSITION AND PLAN:   The patient will be placed in the Emergency Department observation unit for    Dr. Madelyn Flavors to see in the morning, and disposition at that point.  The patient    will be given medication for headache and nausea as needed while she is in    observation.  The patient was dispositioned to observation status in stable    condition.           ___________________   Arvil Persons DO   Dictated By: Zachary George. Adams, PA-C   nt   D:10/20/2010   T: 10/21/2010 07:33:36   [id number]

## 2010-10-20 NOTE — ED Provider Notes (Signed)
KNOWN ALLERGIES   Codeine Sulfate   Morphine Sulfate: Reaction: Itching   Penicillins       TRIAGE   TRIAGE NOTES:  headache x 3 weeks unrelieved from pain meds, with         nausea.   PATIENT: NAME: [name] [name], AGE: 55, GENDER: female, DOB:         Sat [birth year], TIME OF GREET: Sun Oct 20, 2010 19:17, SSN:         [id number], KG WEIGHT: 108.9, HEIGHT: 177cm, MEDICAL RECORD NUMBER:         [phone], ACCOUNT NUMBER: [id number], PCP: Cindie Laroche,.   ADMISSION: URGENCY: 3, DEPT: Emergency, BED: WAITING.   VITAL SIGNS: BP 108/77, (Sitting), Pulse 83, Resp 14, Temp 97.1,         (Oral), Pain 10, O2 Sat 97, on Room air, Time 10/20/2010 19:54.   COMPLAINT:  Migraine.   PRESENTING COMPLAINT:  h/a to top of head, nausea, photophobia,         and ringing in her ears. pt with repeated symptoms for 3 weeks, seen         by neurologist on Thursday and put on Diamox and had had no relief.   PAIN: Patient complains of pain, Pain described as aching, Pain         described as throbbing, Pain described as unbearable, On a scale 0-10         patient rates pain as 10, Pain is constant.   IMMUNIZATIONS:  Last tetanus shot received less than 5 years ago,         flu shot up to date.   LMP: LMP: Hysterectomy.   TREATMENT PRIOR TO ARRIVAL: Medication: Vicodin, Benadryl,.   TB SCREENING: TB screen negative for this patient.   ABUSE SCREENING: Patient denies physical abuse or threats.   FALL RISK: No secondary diagnosis (0), None/bed rest/nurse assist         (0), No IV or IV access (0), Normal/bed rest/wheelchair (0), Oriented         to own ability (0), Total 0.   SUICIDAL IDEATION: Suicidal ideation is not present.   ADVANCE DIRECTIVES: Patient does not have advance directives,         Triage assessment performed.   PROVIDERS: TRIAGE NURSE: Guy Franco, RN.   PREVIOUS VISIT ALLERGIES: Codeine Sulfate, Morphine Sulfate,         Penicillins.       CURRENT MEDICATIONS    Zofran ODT:  1 tab Oral As needed every four hours. x 4 Mg - Oral         - Q4PRN.   Reglan:  1 tab(s) Oral 4 times a day (before meals and at bedti.         x 10 Mg - Oral - AC/HS.   Synthroid:  150 mcg Oral once a day.   Mirapex:  2 mg Oral once a day.   Vicodin:  1-2 tab(s) Oral Q4-6H. x 500 mg-5 mg - Oral - Q4-6H.   Crestor:  10 mg Oral once a day.   Diamox Sequels:  Oral 4 times a day.       MEDICATION SERVICE   Dilaudid:  Order: Dilaudid (Hydromorphone Hydrochloride) -         Dose: 1 mg : IV         Single Dose Exceeded - Rationale: Benefits out weigh risk         Ordered by: Victorino Dike  Himmel Salch, DO         Entered by: Carolin Guernsey, DO Sun Oct 20, 2010 22:11 ,          Acknowledged by: Allyne Gee, RN Sun Oct 20, 2010 22:23         Documented as given by: Allyne Gee, RN Sun Oct 20, 2010 22:42          Patient, Medication, Dose, Route and Time verified prior to         administration.          Time given: 2238, Site: L FA, IVP, Subsequent different medication,         Slowly, Catheter placement confirmed via flush prior to         administration, IV site without signs or symptoms of infiltration         during medication administration, No swelling during administration,         No drainage during administration, IV flushed after administration,         Correct patient, time, route, dose and medication confirmed prior to         administration, Patient advised of actions and side-effects prior to         administration, Allergies confirmed and medications reviewed prior to         administration.    : Follow Up : Time: 2324, Decreased pain, On a scale 0-10         patient rates pain as 5.   DiphenhydrAMINE Hydrochloride:  Order: DiphenhydrAMINE         Hydrochloride (Diphenhydramine Hydrochloride) - Dose: 25 mg         : IV         Ordered by: Phil Dopp, PA-C         Entered by: Phil Dopp, PA-C Sun Oct 20, 2010 20:43 ,           Acknowledged by: Allyne Gee, RN Sun Oct 20, 2010 21:45         Documented as given by: Allyne Gee, RN Sun Oct 20, 2010 22:09          Patient, Medication, Dose, Route and Time verified prior to         administration.          Time given: 2207, Site: L FA, IVP, Initial medication, Slowly,         Catheter placement confirmed via flush prior to administration, IV         site without signs or symptoms of infiltration during medication         administration, No swelling during administration, No drainage during         administration, IV flushed after administration, Correct patient,         time, route, dose and medication confirmed prior to administration,         Patient advised of actions and side-effects prior to administration,         Allergies confirmed and medications reviewed prior to administration.    : Follow Up :  Decreased pain, On a scale 0-10 patient rates         pain as 6.   Reglan:  Order: Reglan (Metoclopramide Hydrochloride) -         Dose: 10 mg : IV         Ordered by: Phil Dopp, PA-C         Entered by: Phil Dopp, PA-C  Sun Oct 20, 2010 20:43 ,          Acknowledged by: Allyne Gee, RN Sun Oct 20, 2010 21:45         Documented as given by: Allyne Gee, RN Sun Oct 20, 2010 22:10          Patient, Medication, Dose, Route and Time verified prior to         administration.          Time given: 2208, Site: L FA, IVP, Subsequent different medication,         Slowly, Catheter placement confirmed via flush prior to         administration, IV site without signs or symptoms of infiltration         during medication administration, No swelling during administration,         No drainage during administration, IV flushed after administration,         Correct patient, time, route, dose and medication confirmed prior to         administration, Patient advised of actions and side-effects prior to         administration, Allergies confirmed and medications reviewed prior to          administration, Patient in position of comfort, Side rails up, Cart         in lowest position, Family at bedside, Call light in reach.    : Follow Up : Time: 2235, Decreased pain, On a scale 0-10         patient rates pain as 6.   Valium:  Order: Valium (Diazepam) - Dose: 10 mg : Oral         Ordered by: Phil Dopp, PA-C         Entered by: Phil Dopp, PA-C Sun Oct 20, 2010 21:06 ,          Acknowledged by: Allyne Gee, RN Sun Oct 20, 2010 21:45         Documented as given by: Allyne Gee, RN Sun Oct 20, 2010 22:09          Patient, Medication, Dose, Route and Time verified prior to         administration.          Time given: 2205, Site: Medication administered P.O., Correct         patient, time, route, dose and medication confirmed prior to         administration, Patient advised of actions and side-effects prior to         administration, Allergies confirmed and medications reviewed prior to         administration.   Zofran:  Order: Zofran (Ondansetron Hydrochloride) -         Dose: 4 mg : Oral         Ordered by: Carolin Guernsey, DO         Entered by: Carolin Guernsey, DO Sun Oct 20, 2010 22:11 ,          Acknowledged by: Allyne Gee, RN Sun Oct 20, 2010 22:23         Documented as given by: Allyne Gee, RN Sun Oct 20, 2010 22:42          Patient, Medication, Dose, Route and Time verified prior to         administration.          Time given: 2238, Site: Medication administered P.O., Correct  patient, time, route, dose and medication confirmed prior to         administration, Patient advised of actions and side-effects prior to         administration, Allergies confirmed and medications reviewed prior to         administration.   Key:     BAH2=Haladyna, RN, Zetta Bills, RN, Glenda  JHS2=Himmel Wheatland,     DO, [employer], PA-C, [place]

## 2010-10-20 NOTE — Discharge Summary (Signed)
[facility]   ED Discharge Summary   NAME:  [name] [name], [name] [name]   SEX:   F   DOB: [birth year]   MR#    [id number]   ROOM:     ACCT#  [id number]               Date and time of placement:  10/20/2010 at 2200   Date and time of discharge:  10/21/2010 at 12 p.m.       HISTORY OF PRESENT ILLNESS:   This is a 55 year old female who presented with a headache.  She has been    having an ongoing headache for 3 weeks, had 2 LPs done she states and CAT scan    and was diagnosed with pseudotumor cerebri.  She is being cared for by Dr.    Madelyn Flavors, has been on Diamox and the headaches have been continuing.  She was    seen in the ED yesterday, was given some Benadryl and Reglan.  Headache    continued and given some Dilaudid and Valium and placed in observation, to be    seen by Dr. Madelyn Flavors.  Dr. Madelyn Flavors saw the patient and started her on Topamax    and is having her increase her Diamox, and he will follow up with her in the    office.       PHYSICAL EXAMINATION:     VITAL SIGNS:  Blood pressure 129/84, pulse 88, respirations 16, temperature    97.5.   GENERAL:  Well-developed, well-nourished female, alert, tearful.   HEENT:  Eyes:  Conjunctivae clear, lids normal.  Pupils equal, symmetrical,    and normally reactive.  EOMs intact.  No facial droop.   NECK:  Supple, nontender, symmetrical, no masses or JVD, trachea midline.     Thyroid not enlarged, nodular, or tender.    LYMPHATIC:  No cervical or submandibular lymphadenopathy palpated.    RESPIRATORY:  Clear and equal breath sounds.  No respiratory distress,    tachypnea, or accessory muscle use.    NEUROLOGICAL:  The patient alert, no acute focal deficits.       INITIAL IMPRESSION:   The patient having headache at this time.  It is a headache she has been    having.  She was given a dose of Dilaudid.  We will also give her some    Benadryl IV.  Daughter states the patient has been very anxious because this     headache has been ongoing.  They were told that it may take weeks for the    medications to help with her headache from the pseudotumor cerebri.  Ativan    p.o. given for her anxiety.       FINAL DIAGNOSES:   1. Recurrent headache.   2. Pseudotumor cerebri.       PLAN:   The patient discharged.  Prescription for Ativan given.  She is to increase    after Diamox as instructed by Dr. Madelyn Flavors and take Topamax as prescribed.  The    patient evaluated by myself and Dr. Cipriano Mile who agrees with the above    assessment and plan.           ___________________   Elsie Saas MD   Dictated EA:VWUJW A. [place], [place]   jj   D:10/21/2010   T: 10/21/2010 12:11:00   [id number]

## 2010-10-20 NOTE — ED Provider Notes (Signed)
KNOWN ALLERGIES   Codeine Sulfate   Morphine Sulfate: Reaction: Itching   Penicillins       TRIAGE Wynelle Link Oct 20, 2010 19:57 GCG1)   TRIAGE NOTES:  headache x 3 weeks unrelieved from pain meds, with         nausea. Wynelle Link Oct 20, 2010 19:57 GCG1)   PATIENT: NAME: Catalyna, Reilly, AGE: 55, GENDER: female, DOB:         Sat April 27, 1956, TIME OF GREET: Sun Oct 20, 2010 19:17, SSN:         [id number], KG WEIGHT: 108.9, HEIGHT: 177cm, MEDICAL RECORD NUMBER:         [id number], ACCOUNT NUMBER: [id number], PCP: Cindie Laroche,. Wynelle Link         Oct 20, 2010 19:57 GCG1)   ADMISSION: URGENCY: 3, DEPT: Emergency, BED: WAITING. Wynelle Link Oct 20, 2010 19:57 GCG1)   VITAL SIGNS: BP 108/77, (Sitting), Pulse 83, Resp 14, Temp 97.1,         (Oral), Pain 10, O2 Sat 97, on Room air, Time 10/20/2010 19:54. (19:54         GCG1)   COMPLAINT:  Migraine. Wynelle Link Oct 20, 2010 19:57 GCG1)   PRESENTING COMPLAINT:  h/a to top of head, nausea, photophobia,         and ringing in her ears. pt with repeated symptoms for 3 weeks, seen         by neurologist on Thursday and put on Diamox and had had no relief.         (20:15 Longinus.Benders)   PAIN: Patient complains of pain, Pain described as aching, Pain         described as throbbing, Pain described as unbearable, On a scale 0-10         patient rates pain as 10, Pain is constant. (20:15 BAH2)   IMMUNIZATIONS:  Last tetanus shot received less than 5 years ago,         flu shot up to date. (20:15 BAH2)   LMP: LMP: Hysterectomy. (20:15 BAH2)   TREATMENT PRIOR TO ARRIVAL: Medication: Vicodin, Benadryl,.         (20:15 BAH2)   TB SCREENING: TB screen negative for this patient. (20:15         [place].Benders)   ABUSE SCREENING: Patient denies physical abuse or threats. (20:15         BAH2)   FALL RISK: No secondary diagnosis (0), None/bed rest/nurse assist         (0), No IV or IV access (0), Normal/bed rest/wheelchair (0), Oriented         to own ability (0), Total 0. (20:15 BAH2)    SUICIDAL IDEATION: Suicidal ideation is not present. (20:15         [place].Benders)   ADVANCE DIRECTIVES: Patient does not have advance directives,         Triage assessment performed. [phone])   PROVIDERS: TRIAGE NURSE: Guy Franco, RN. Wynelle Link Oct 20, 2010         19:57 GCG1)   PREVIOUS VISIT ALLERGIES: Codeine Sulfate, Morphine Sulfate,         Penicillins. Wynelle Link Oct 20, 2010 19:57 GCG1)       CURRENT MEDICATIONS (19:58 GCG1)   Zofran ODT:  1 tab Oral As needed every four hours. x 4 Mg - Oral         - Q4PRN.   Reglan:  1 tab(s) Oral  4 times a day (before meals and at bedti.         x 10 Mg - Oral - AC/HS.   Synthroid:  150 mcg Oral once a day.   Mirapex:  2 mg Oral once a day.   Vicodin:  1-2 tab(s) Oral Q4-6H. x 500 mg-5 mg - Oral - Q4-6H.   Crestor:  10 mg Oral once a day.   Diamox Sequels:  Oral 4 times a day.       MEDICATION SERVICE   Dilaudid:  Order: Dilaudid (Hydromorphone Hydrochloride) -         Dose: 1 mg : IV         Single Dose Exceeded - Rationale: Benefits out weigh risk         Ordered by: Carolin Guernsey, DO         Entered by: Carolin Guernsey, DO Sun Oct 20, 2010 22:11 ,          Acknowledged by: Allyne Gee, RN Sun Oct 20, 2010 22:23         Documented as given by: Allyne Gee, RN Sun Oct 20, 2010 22:42          Patient, Medication, Dose, Route and Time verified prior to         administration.          Time given: 2238, Site: L FA, IVP, Subsequent different medication,         Slowly, Catheter placement confirmed via flush prior to         administration, IV site without signs or symptoms of infiltration         during medication administration, No swelling during administration,         No drainage during administration, IV flushed after administration,         Correct patient, time, route, dose and medication confirmed prior to         administration, Patient advised of actions and side-effects prior to         administration, Allergies confirmed and medications reviewed prior  to         administration.    : Follow Up : Time: 2324, Decreased pain, On a scale 0-10         patient rates pain as 5. (23:24 BAH2)   DiphenhydrAMINE Hydrochloride:  Order: DiphenhydrAMINE         Hydrochloride (Diphenhydramine Hydrochloride) - Dose: 25 mg         : IV         Ordered by: Phil Dopp, PA-C         Entered by: Phil Dopp, PA-C Sun Oct 20, 2010 20:43 ,          Acknowledged by: Allyne Gee, RN Sun Oct 20, 2010 21:45         Documented as given by: Allyne Gee, RN Sun Oct 20, 2010 22:09          Patient, Medication, Dose, Route and Time verified prior to         administration.          Time given: 2207, Site: L FA, IVP, Initial medication, Slowly,         Catheter placement confirmed via flush prior to administration, IV         site without signs or symptoms of infiltration during medication         administration, No swelling during administration, No drainage during  administration, IV flushed after administration, Correct patient,         time, route, dose and medication confirmed prior to administration,         Patient advised of actions and side-effects prior to administration,         Allergies confirmed and medications reviewed prior to administration.    : Follow Up :  Decreased pain, On a scale 0-10 patient rates         pain as 6. (22:35 BAH2)   Reglan:  Order: Reglan (Metoclopramide Hydrochloride) -         Dose: 10 mg : IV         Ordered by: Phil Dopp, PA-C         Entered by: Phil Dopp, PA-C Sun Oct 20, 2010 20:43 ,          Acknowledged by: Allyne Gee, RN Sun Oct 20, 2010 21:45         Documented as given by: Allyne Gee, RN Sun Oct 20, 2010 22:10          Patient, Medication, Dose, Route and Time verified prior to         administration.          Time given: 2208, Site: L FA, IVP, Subsequent different medication,         Slowly, Catheter placement confirmed via flush prior to          administration, IV site without signs or symptoms of infiltration         during medication administration, No swelling during administration,         No drainage during administration, IV flushed after administration,         Correct patient, time, route, dose and medication confirmed prior to         administration, Patient advised of actions and side-effects prior to         administration, Allergies confirmed and medications reviewed prior to         administration, Patient in position of comfort, Side rails up, Cart         in lowest position, Family at bedside, Call light in reach.    : Follow Up : Time: 2235, Decreased pain, On a scale 0-10         patient rates pain as 6. (22:35 ZOX0)   Valium:  Order: Valium (Diazepam) - Dose: 10 mg : Oral         Ordered by: Phil Dopp, PA-C         Entered by: Phil Dopp, PA-C Sun Oct 20, 2010 21:06 ,          Acknowledged by: Allyne Gee, RN Sun Oct 20, 2010 21:45         Documented as given by: Allyne Gee, RN Sun Oct 20, 2010 22:09          Patient, Medication, Dose, Route and Time verified prior to         administration.          Time given: 2205, Site: Medication administered P.O., Correct         patient, time, route, dose and medication confirmed prior to         administration, Patient advised of actions and side-effects prior to         administration, Allergies confirmed and medications reviewed prior to         administration.   Zofran:  Order: Zofran (  Ondansetron Hydrochloride) -         Dose: 4 mg : Oral         Ordered by: Carolin Guernsey, DO         Entered by: Carolin Guernsey, DO Sun Oct 20, 2010 22:11 ,          Acknowledged by: Allyne Gee, RN Sun Oct 20, 2010 22:23         Documented as given by: Allyne Gee, RN Sun Oct 20, 2010 22:42          Patient, Medication, Dose, Route and Time verified prior to         administration.          Time given: 2238, Site: Medication administered P.O., Correct          patient, time, route, dose and medication confirmed prior to         administration, Patient advised of actions and side-effects prior to         administration, Allergies confirmed and medications reviewed prior to         administration.       ORDERS   IV- Saline Lock:  Ordered for: Wenda Overland, DO, Jennifer         Status: Done by Loni Beckwith, RN, Delight Stare Oct 20, 2010 22:11. (20:44         MMA)   BASIC METABOLIC PANEL:  Ordered for: Wenda Overland, DO, Victorino Dike         Status: Done by System Sun Oct 20, 2010 22:33. (21:02 MMA)   Page Dr Janice Coffin:  Ordered for: Wenda Overland, DO, Victorino Dike         Status: Done by Veto Kemps Oct 20, 2010 22:12. (22:10         JHS2)   ED OBSERVATION for CEP:  Ordered for: Wenda Overland, DO, Jennifer         Status: Done by Veto Kemps Oct 20, 2010 23:19. (23:16         JHS2)   Dial A Flow Tubing:  Ordered for: Wenda Overland, DO, Jennifer         Status: Active. (23:25 BAH2)   HEPLOCK SET-UP:  Ordered for: Himmel Salch, DO, Jennifer         Status: Active. (23:25 Darius Bump)   Elita Boone IV Cath:  Ordered for: Wenda Overland, DO, Jennifer         Status: Active. (23:25 [facility].Benders)       NURSING ASSESSMENT: HEADACHE (20:17 [facility].Benders)   CONSTITUTIONAL: Complex assessment performed, History obtained         from patient, Patient arrives, via hospital wheelchair, Unsteady         gait, Assistance to cart, Patient appears, uncomfortable, Patient         cooperative, Patient alert, Oriented to person, place and time, Skin         warm, Skin dry, Skin normal in color, Mucous membranes pink, Mucous         membranes moist, Patient is well-groomed.   PAIN: aching pain, throbbing pain, unbearable pain, top of head,         Onset of pain 3 weeks, constant, on a scale 0-10 patient rates pain         as 10.   HEADACHE: Associated with difficulty concentrating, not         associated with diplopia, Associated with nausea, Associated with  photophobia, Associated with phonophobia, Notes: pt reports blurred         vision.   NEURO: Pupils equally round and reactive to light, Left pupil 5         mm in size, Right pupil 5 mm in size, Able to close eyes, Face         symmetrical, Speech normal, Visual changes described as, blurred         vision, no paresthesias, GCS:, Eye opening: (4) - Spontaneous,         Verbal: (5) - Oriented/conversive, Motor: (6) - Obeys         commands/Spontaneous, Notes: pt reports feeling very unsteady on her         feet.   SAFETY: Side rails up, Cart/Stretcher in lowest position, Family         at bedside, Call light within reach, Hospital ID band on.       NURSING PROCEDURE: ADMISSION (23:22 [facility].Benders)   ADMISSION: Report called to, Mellow Rn, Provided opportunity to         answer questions, Admission orders received and completed, Report         called at 2320, Transported via cart/stretcher, Accompanied by         registered nurse, Transported with IV fluids, Notes: pt admitted to         OBS.       NURSING PROCEDURE: IV   PATIENT IDENITIFIER: Patient's identity verified by patient         stating name, Patient's identity verified by patient stating birth         date, Patient's identity verified by hospital ID bracelet. (20:38         [facility].Benders)     Patient's identity verified by patient stating name, Patient's identity         verified by patient stating birth date, Patient's identity verified         by hospital ID bracelet. (22:05 BAH2)   IV SITE 1: IV therapy indicated for medication administration, IV         established, to the left hand, using a 22 gauge catheter, in one         attempt, Flushed with normal saline (mls): 10ml, Labs drawn at time         of placement, labeled in the presence of the patient and sent to lab.         (20:38 BAH2)     IV therapy indicated for medication administration, IV established, to         the left forearm, using a 20 gauge catheter, in one attempt, Saline          lock established, Flushed with normal saline (mls): 10ml, Labs drawn         at time of placement, labeled in the presence of the patient and sent         to lab. (22:05 BAH2)   FOLLOW-UP SITE 1: After procedure, sterile transparent dressing         applied, After procedure, no drainage at IV site, After procedure, no         swelling at IV site. [facility])     After procedure, sterile transparent dressing applied, After procedure,         no drainage at IV site, After procedure, no swelling at IV site, IV         discontinued, due to pain at site, catheter  intact. (21:45 BAH2)     After procedure, sterile transparent dressing applied, After procedure,         no drainage at IV site, After procedure, no swelling at IV site.         (22:05 [facility].Benders)   NOTES: Procedure done by Peggye Ley Rn, Notes: with ultrasound         guidance. (22:05 BAH2)       NURSING PROCEDURE: NURSE NOTES   NURSES NOTES: Notes: Pt c/o pain at iv site, no swelling noted,         flushes with ease, iv d/c d/t pt c/o pain, picc team contacted to         obtain iv access as pt states has history of being very difficult to         obtain iv access and labs on. [facility])     Notes: picc team at bedside. (21:50 BAH2)     Notes: pt states pain decreased 6/10, remains with photophobia,         additional med orders noted. (22:35 BAH2)     Notes: Pts family going home at this time, daughter, Warnell Forester         would like to be contactes with any changes, [phone]. (22:45         Darius Bump)     Notes: Pt sleeping, easily roused, states feeling more comfortable, aware         of pending admission to obs. (23:15 BAH2)       DIAGNOSIS (23:16 JHS2)   FINAL: PRIMARY: Headache, ADDITIONAL: Ataxia, nausea, pseudotumor         cerebri.       DISPOSITION   PATIENT:  Disposition Type: Observation, Disposition: ED         Observation, Condition: Stable. (23:16 JHS2)      IV Infusion: IV still infusing, Patient left the department. [facility])       INSTRUCTION Sheral Flow Oct 21, 2010 11:15 DIH0)   DISCHARGE:  HEADACHE (CEPHALGIA).   [facility] Revel, MEDICINE, [address]         [place], [place] [address], [phone].   SPECIAL:  follow up with Dr. Madelyn Flavors         take the topomax as prescribed         increase your diamox as instructed by Dr. Madelyn Flavors.   Key:     BAH2=Haladyna, RN, Elane Fritz  DIH0=Houle, PA-C, Bland Span, RN,     Glenda     JHS2=Himmel Salch, DO, [employer], PA-C, [address]

## 2011-11-04 NOTE — ED Provider Notes (Signed)
MEDICATION ADMINISTRATION SUMMARY              Drug Name: Reglan, Dose Ordered: 10 mg, Route: IV Push, Status:         Given, Time: 18:19 11/04/2011,          Drug Name: DiphenhydrAMINE Hydrochloride, Dose Ordered: 25 mg, Route:         IV Push, Status: Given, Time: 18:19 11/04/2011, Detailed record         available in Medication Service section.       KNOWN ALLERGIES   Codeine Sulfate   Morphine Sulfate: Reaction: Itching   Penicillins       TRIAGE (Tue Nov 04, 2011 15:29 MML1)   PATIENT: NAME: [name] [name], AGE: 56, GENDER: female, DOB:         Sat Jul 31, 1956, TIME OF GREET: Tue Nov 04, 2011 15:19, SSN:         [id number], KG WEIGHT: 91.2, HEIGHT: 170cm, MEDICAL RECORD NUMBER:         [id number], ACCOUNT NUMBER: [id number], PCP: Dr Horace Porteous,. (Tue Nov 04, 2011 15:29 MML1)   ADMISSION: URGENCY: 4, TRANSPORT: Ambulatory, DEPT: Emergency,         BED: WAITING. (Tue Nov 04, 2011 15:29 MML1)   VITAL SIGNS: BP 133/88, Pulse 90, Resp 18, Temp 96.1, (Oral),         Pain 9, O2 Sat 99, on Room air, Time 11/04/2011 15:26. (15:26         MML1)   COMPLAINT:  Ha. (Tue Nov 04, 2011 15:29 MML1)   PRESENTING COMPLAINT:  h/a. (16:39 MTM1)   TB SCREENING: TB screen not applicable for this patient. (16:39         MTM1)   ABUSE SCREENING: Not Applicable. (16:39 MTM1)   FALL RISK: Fall risk assessment not applicable to this patient.         (16:39 MTM1)   SUICIDAL IDEATION: Not Applicable. (16:39 MTM1)   ADVANCE DIRECTIVES: Unknown if patient has advance directives.         (16:39 MTM1)   PROVIDERS: TRIAGE NURSE: Lanny Hurst, RN. (Tue Nov 04, 2011         15:29 MML1)   PREVIOUS VISIT ALLERGIES: Codeine Sulfate, Morphine Sulfate,         Penicillins. Halford Decamp Nov 04, 2011 15:29 MML1)       PRESENTING PROBLEM (Tue Nov 04, 2011 15:29 MML1)      Presenting problems: Headache.       CURRENT MEDICATIONS (15:29 MML1)   Synthroid:  150 mcg Oral once a day.   Crestor:  10 mg Oral once a day.    Reglan:  1 tab(s) Oral 4 times a day (before meals and at bedti.         x 10 Mg - Oral - AC/HS.   Vicodin:  1-2 tab(s) Oral Q4-6H. x 500 mg-5 mg - Oral - Q4-6H.   Diamox Sequels:  Oral 4 times a day.   Ativan:  1 tab(s) Oral 3 times a day. x 0.5 Mg - Oral - TID.   Mirapex:  2 mg Oral once a day.   Zofran ODT:  1 tab Oral As needed every four hours. x 4 Mg - Oral         - Q4PRN.       MEDICATION SERVICE (18:19 COL1)   DiphenhydrAMINE Hydrochloride:  Order: DiphenhydrAMINE  Hydrochloride (Diphenhydramine Hydrochloride) - Dose: 25 mg         : IV Push         Ordered by: Verlin Grills, PA-C         Entered by: Verlin Grills, PA-C Tue Nov 04, 2011 17:05 ,          Acknowledged by: Melrico Julious Payer) Morido, RN Tue Nov 04, 2011 17:06         Documented as given by: Melrico Julious Payer) Morido, RN Tue Nov 04, 2011         18:19          Patient, Medication, Dose, Route and Time verified prior to         administration.          Amount given: 25mg , IV SITE #1 into right forearm, IV SITE #1 IVP,         initial medication, Slowly, Connections checked prior to         administration, Line traced prior to administration, Catheter         placement confirmed via flush prior to administration, IV site         without signs or symptoms of infiltration during medication         administration, No swelling during administration, No drainage during         administration, IV flushed after administration, Correct patient,         time, route, dose and medication confirmed prior to administration,         Patient advised of actions and side-effects prior to administration,         Allergies confirmed and medications reviewed prior to administration.   Reglan:  Order: Reglan (Metoclopramide Hydrochloride) -         Dose: 10 mg : IV Push         Ordered by: Verlin Grills, PA-C         Entered by: Verlin Grills, PA-C Tue Nov 04, 2011 17:05 ,          Acknowledged by: Melrico Julious Payer) Morido, RN Tue Nov 04, 2011 17:06          Documented as given by: Melrico Julious Payer) Morido, RN Tue Nov 04, 2011         18:19          Patient, Medication, Dose, Route and Time verified prior to         administration.          Amount given: 10mg , IV SITE #1 into right forearm, IV SITE #1 IVP,         initial medication, Slowly, Connections checked prior to         administration, Line traced prior to administration, Catheter         placement confirmed via flush prior to administration, IV site         without signs or symptoms of infiltration during medication         administration, No swelling during administration, No drainage during         administration, IV flushed after administration, Correct patient,         time, route, dose and medication confirmed prior to administration,         Patient advised of actions and side-effects prior to administration,         Allergies confirmed and medications reviewed prior to administration.       ORDERS   IV-  Saline Lock:  Ordered for: Buddy Duty, M.D., Toni Amend         Status: Done by Trilby Leaver, RN, Melrico Julious Payer) Tue Nov 04, 2011 18:18.         (17:07 COL1)   please check visual acuity:  Ordered for: Buddy Duty, M.D., Toni Amend         Status: Done by York Grice III, Levora Angel Nov 04, 2011 18:39.         (17:14 CTZ0)   IV Start kit:  Ordered for: Buddy Duty, M.D., Toni Amend         Status: Active. (19:15 MTM1)   Elita Boone IV Cath:  Ordered for: Buddy Duty, M.D., Courtney         Status: Active. (19:15 MTM1)       NURSING ASSESSMENT: HEADACHE (16:39 MTM1)   CONSTITUTIONAL: Patient arrives ambulatory, Gait steady, Patient         cooperative, Patient alert, Oriented to person, place and time, Skin         warm, Skin dry, Skin normal in color, Mucous membranes pink, Mucous         membranes moist, Patient is well-groomed.   PAIN: aching pain, to the frontal region, to the occipital         region, on a scale 0-10 patient rates pain as 9.   HEADACHE: no history of migraines, Associated with nausea, no          associated vomiting, Associated with photophobia.       NURSING PROCEDURE: DISCHARGE NOTE (19:14 MTM1)   DISCHARGE: Patient discharged to home, ambulating without         assistance, family driving, accompanied by other family member,         Summary of Care printed/ provided, Patient requested and was provided         an electronic copy of Discharge Instructions, Discharge instructions         given to patient, Prescriptions given and instructions on side         effects given, Above person(s) verbalized understanding of discharge         instructions and follow-up care.       NURSING PROCEDURE: EYE CARE (18:39 JEC3)   PATIENT IDENTIFIER: Patient's identity verified by patient         stating name, Patient's identity verified by patient stating birth         date, Patient's identity verified by hospital ID bracelet, Patient         actively involved in identification process.   FOLLOW-UP: After procedure, visual acuity, left eye: 20/100,         right eye: 20/200, both eyes: 20/100, Pt used eyeglasses.   NOTES: Patient tolerated procedure well.   SAFETY: Side rails up, Cart/Stretcher in lowest position, Family         at bedside, Call light within reach, Hospital ID band on.       NURSING PROCEDURE: IV (18:27 JEC3)   PATIENT IDENITIFIER: Patient's identity verified by patient         stating name, Patient's identity verified by patient stating birth         date, Patient's identity verified by hospital ID bracelet, Patient         actively involved in identification process.   IV SITE 1: IV established, to the right forearm, using a 22 gauge         catheter, in two attempts, Saline lock established, Flushed with  normal saline (mls): 10, Tourniquet removed from patient after         procedure.   FOLLOW-UP SITE 1: After procedure, sterile transparent dressing         applied, After procedure, IV line connections checked and properly          labeled, After procedure, no drainage at IV site, After procedure, no         swelling at IV site, After procedure, no redness at IV site.   NOTES: Patient tolerated procedure well.   SAFETY: Side rails up, Cart/Stretcher in lowest position, Call         light within reach, Hospital ID band on.       DIAGNOSIS (18:49 COL1)   FINAL: PRIMARY: Cephalgia.       DISPOSITION   PATIENT:  Disposition Type: Discharged, Disposition: Discharge         with Psych F/U, Condition: Stable. (18:49 COL1)      Disposition Transport: Family/Friend drive, Patient left the department.         (19:15 MTM1)       VITAL SIGNS   VITAL SIGNS: BP: 133/88, Pulse: 90, Resp: 18, Temp: 96.1 (Oral),         Pain: 9, O2 sat: 99 on Room air, Time: 11/04/2011 15:26. (15:26         MML1)     BP: 132/80 (Sitting), Pulse: 84, Resp: 20, Time: 11/04/2011 18:14. (18:14         MTM1)       INSTRUCTION (18:50 COL1)   DISCHARGE:  HEADACHE (CEPHALGIA).   SPECIAL:  Follow up with Neurologist tomorrow to schedule         outpatient lumbar puncture.         Return to the ER if condition worsens or new symptoms develop.       PRESCRIPTION     No recorded prescriptions   Key:     COL1=OLeary, PA-C, Colleen  CTZ0=Zydron, M.D., [facility],     ACT III, Fredrik Rigger     MML1=Lopez, RN, [facility]  MTM1=Morido, RN, Melrico ([place])

## 2017-10-27 ENCOUNTER — Inpatient Hospital Stay
Admit: 2017-10-27 | Discharge: 2017-10-29 | Disposition: A | Discharge status: Home / Self Care | Payer: PRIVATE HEALTH INSURANCE | Attending: Internal Medicine | Admitting: Internal Medicine

## 2017-10-27 ENCOUNTER — Emergency Department: Admit: 2017-10-27 | Payer: PRIVATE HEALTH INSURANCE | Primary: Internal Medicine

## 2017-10-27 ENCOUNTER — Emergency Department: Payer: PRIVATE HEALTH INSURANCE | Primary: Internal Medicine

## 2017-10-27 DIAGNOSIS — I951 Orthostatic hypotension: Secondary | ICD-10-CM

## 2017-10-27 LAB — METABOLIC PANEL, BASIC
Anion gap: 8 mmol/L (ref 3.0–18)
BUN/Creatinine ratio: 14 (ref 12–20)
BUN: 22 MG/DL — ABNORMAL HIGH (ref 7.0–18)
CO2: 22 mmol/L (ref 21–32)
Calcium: 7.4 MG/DL — ABNORMAL LOW (ref 8.5–10.1)
Chloride: 113 mmol/L — ABNORMAL HIGH (ref 100–108)
Creatinine: 1.53 MG/DL — ABNORMAL HIGH (ref 0.6–1.3)
GFR est AA: 42 mL/min/{1.73_m2} — ABNORMAL LOW (ref >60)
GFR est non-AA: 35 mL/min/{1.73_m2} — ABNORMAL LOW (ref >60)
Glucose: 78 mg/dL (ref 74–99)
Potassium: 3.9 mmol/L (ref 3.5–5.5)
Sodium: 143 mmol/L (ref 136–145)

## 2017-10-27 LAB — CBC WITH AUTOMATED DIFF
ABS. BASOPHILS: 0 10*3/uL (ref 0.0–0.1)
ABS. BASOPHILS: 0 10*3/uL (ref 0.0–0.1)
ABS. BASOPHILS: 0 10*3/uL (ref 0.0–0.1)
ABS. EOSINOPHILS: 0.1 10*3/uL (ref 0.0–0.4)
ABS. EOSINOPHILS: 0.1 10*3/uL (ref 0.0–0.4)
ABS. EOSINOPHILS: 0.1 10*3/uL (ref 0.0–0.4)
ABS. LYMPHOCYTES: 0.8 10*3/uL — ABNORMAL LOW (ref 0.9–3.6)
ABS. LYMPHOCYTES: 1.1 10*3/uL (ref 0.9–3.6)
ABS. LYMPHOCYTES: 1.5 10*3/uL (ref 0.9–3.6)
ABS. MONOCYTES: 0.5 10*3/uL (ref 0.05–1.2)
ABS. MONOCYTES: 0.5 10*3/uL (ref 0.05–1.2)
ABS. MONOCYTES: 0.5 10*3/uL (ref 0.05–1.2)
ABS. NEUTROPHILS: 4.7 10*3/uL (ref 1.8–8.0)
ABS. NEUTROPHILS: 4.9 10*3/uL (ref 1.8–8.0)
ABS. NEUTROPHILS: 6 10*3/uL (ref 1.8–8.0)
BASOPHILS: 0 % (ref 0–2)
BASOPHILS: 0 % (ref 0–2)
BASOPHILS: 0 % (ref 0–2)
EOSINOPHILS: 1 % (ref 0–5)
EOSINOPHILS: 1 % (ref 0–5)
EOSINOPHILS: 1 % (ref 0–5)
HCT: 33.9 % — ABNORMAL LOW (ref 35.0–45.0)
HCT: 34.8 % — ABNORMAL LOW (ref 35.0–45.0)
HCT: 35.8 % (ref 35.0–45.0)
HGB: 11.2 g/dL — ABNORMAL LOW (ref 12.0–16.0)
HGB: 11.5 g/dL — ABNORMAL LOW (ref 12.0–16.0)
HGB: 11.6 g/dL — ABNORMAL LOW (ref 12.0–16.0)
LYMPHOCYTES: 13 % — ABNORMAL LOW (ref 21–52)
LYMPHOCYTES: 18 % — ABNORMAL LOW (ref 21–52)
LYMPHOCYTES: 18 % — ABNORMAL LOW (ref 21–52)
MCH: 30.2 PG (ref 24.0–34.0)
MCH: 30.6 PG (ref 24.0–34.0)
MCH: 30.7 PG (ref 24.0–34.0)
MCHC: 32.4 g/dL (ref 31.0–37.0)
MCHC: 33 g/dL (ref 31.0–37.0)
MCHC: 33 g/dL (ref 31.0–37.0)
MCV: 92.6 FL (ref 74.0–97.0)
MCV: 92.9 FL (ref 74.0–97.0)
MCV: 93.2 FL (ref 74.0–97.0)
MONOCYTES: 6 % (ref 3–10)
MONOCYTES: 8 % (ref 3–10)
MONOCYTES: 8 % (ref 3–10)
MPV: 12 FL — ABNORMAL HIGH (ref 9.2–11.8)
MPV: 12.3 FL — ABNORMAL HIGH (ref 9.2–11.8)
MPV: 12.4 FL — ABNORMAL HIGH (ref 9.2–11.8)
NEUTROPHILS: 73 % (ref 40–73)
NEUTROPHILS: 75 % — ABNORMAL HIGH (ref 40–73)
NEUTROPHILS: 78 % — ABNORMAL HIGH (ref 40–73)
PLATELET: 187 10*3/uL (ref 135–420)
PLATELET: 190 10*3/uL (ref 135–420)
PLATELET: 203 10*3/uL (ref 135–420)
RBC: 3.65 M/uL — ABNORMAL LOW (ref 4.20–5.30)
RBC: 3.76 M/uL — ABNORMAL LOW (ref 4.20–5.30)
RBC: 3.84 M/uL — ABNORMAL LOW (ref 4.20–5.30)
RDW: 14.5 % (ref 11.6–14.5)
RDW: 14.5 % (ref 11.6–14.5)
RDW: 14.5 % (ref 11.6–14.5)
WBC: 6.2 10*3/uL (ref 4.6–13.2)
WBC: 6.4 10*3/uL (ref 4.6–13.2)
WBC: 8.1 10*3/uL (ref 4.6–13.2)

## 2017-10-27 LAB — TROPONIN I
Troponin-I, QT: <0.02 NG/ML (ref 0.0–0.045)
Troponin-I, QT: <0.02 NG/ML (ref 0.0–0.045)

## 2017-10-27 LAB — POC LACTIC ACID: Lactic Acid (POC): 0.38 mmol/L — ABNORMAL LOW (ref 0.40–2.00)

## 2017-10-27 LAB — PTT: aPTT: 82.3 s — ABNORMAL HIGH (ref 23.0–36.4)

## 2017-10-27 MED ORDER — NOREPINEPHRINE 8 MG/250 ML (32 MCG/ML) D5W INFUSION
8 mg/250 mL (32 mcg/mL) | INTRAVENOUS | Status: DC
Start: 2017-10-27 — End: 2017-10-28
  Administered 2017-10-27 – 2017-10-28 (×3): via INTRAVENOUS

## 2017-10-27 MED ORDER — SODIUM CHLORIDE 0.9% BOLUS IV
0.9 % | Freq: Once | INTRAVENOUS | Status: AC
Start: 2017-10-27 — End: 2017-10-27
  Administered 2017-10-27: 21:00:00 via INTRAVENOUS

## 2017-10-27 MED ORDER — IOPAMIDOL 76 % IV SOLN
370 mg iodine /mL (76 %) | Freq: Once | INTRAVENOUS | Status: AC
Start: 2017-10-27 — End: 2017-10-27
  Administered 2017-10-27: 22:00:00 via INTRAVENOUS

## 2017-10-27 MED ORDER — DEXTROSE 5% IN WATER (D5W) IV
1 mg/mL | INTRAVENOUS | Status: DC
Start: 2017-10-27 — End: 2017-10-27

## 2017-10-27 MED ORDER — HEPARIN (PORCINE) IN D5W 25,000 UNIT/250 ML IV
25000 unit/250 mL(100 unit/mL) | INTRAVENOUS | Status: DC
Start: 2017-10-27 — End: 2017-10-28
  Administered 2017-10-28 (×3): via INTRAVENOUS

## 2017-10-27 MED ORDER — SODIUM CHLORIDE 0.9% BOLUS IV
0.9 % | Freq: Once | INTRAVENOUS | Status: AC
Start: 2017-10-27 — End: 2017-10-27
  Administered 2017-10-27: 19:00:00 via INTRAVENOUS

## 2017-10-27 MED ORDER — HYDROMORPHONE (PF) 1 MG/ML IJ SOLN
1 mg/mL | Freq: Once | INTRAMUSCULAR | Status: AC
Start: 2017-10-27 — End: 2017-10-27
  Administered 2017-10-27: 20:00:00 via INTRAVENOUS

## 2017-10-27 MED ORDER — HEPARIN (PORCINE) 1,000 UNIT/ML IJ SOLN
1000 unit/mL | Freq: Once | INTRAMUSCULAR | Status: AC
Start: 2017-10-27 — End: 2017-10-27
  Administered 2017-10-27: 20:00:00 via INTRAVENOUS

## 2017-10-27 MED ORDER — NOREPINEPHRINE 8 MG/250 ML (32 MCG/ML) D5W INFUSION
8 mg/250 mL (32 mcg/mL) | INTRAVENOUS | Status: AC
Start: 2017-10-27 — End: 2017-10-27
  Administered 2017-10-27: 22:00:00 via INTRAVENOUS

## 2017-10-27 MED ORDER — MORPHINE 2 MG/ML INJECTION
2 mg/mL | INTRAMUSCULAR | Status: DC
Start: 2017-10-27 — End: 2017-10-27
  Administered 2017-10-27: 19:00:00 via INTRAVENOUS

## 2017-10-27 MED FILL — HEPARIN (PORCINE) 1,000 UNIT/ML IJ SOLN: 1000 unit/mL | INTRAMUSCULAR | Qty: 10

## 2017-10-27 MED FILL — SODIUM CHLORIDE 0.9 % IV: INTRAVENOUS | Qty: 1000

## 2017-10-27 MED FILL — HYDROMORPHONE (PF) 1 MG/ML IJ SOLN: 1 mg/mL | INTRAMUSCULAR | Qty: 1

## 2017-10-27 MED FILL — ISOVUE-370  76 % INTRAVENOUS SOLUTION: 370 mg iodine /mL (76 %) | INTRAVENOUS | Qty: 100

## 2017-10-27 MED FILL — LEVOPHED 1 MG/ML INTRAVENOUS SOLUTION: 1 mg/mL | INTRAVENOUS | Qty: 16

## 2017-10-27 MED FILL — NOREPINEPHRINE 8 MG/250 ML (32 MCG/ML) D5W INFUSION: 8 mg/250 mL (32 mcg/mL) | INTRAVENOUS | Qty: 250

## 2017-10-27 NOTE — Consults (Signed)
[facility] - Consult Note    Date of  Admission: 10/27/2017  1:38 PM     Primary Care Physician:  None     Plan:     1) possible ACS- initial EKG- no ST elevation or St depression inspite of chest pain. Pain partially reproducible with palpation. Start heparin drip due to CAD history. Continue aspirin/ BB/ brilanta and statin.  2) CAD with recent PCI to LAD and PTCA to Diagonal.  3) htn  4) dyslipidemia    Will f/u.  Iv fluid bolus and treat for possible GERD symptoms.       Assessment:     Hospital Problems  Never Reviewed          Codes Class Noted POA    Hypotension ICD-10-CM: I95.9  ICD-9-CM: 458.9  10/27/2017 Unknown        Hypotension due to hypovolemia ICD-10-CM: I95.89, E86.1  ICD-9-CM: 458.8, 276.52  10/27/2017 Unknown                   History of Present Illness:         This is a 62 y.o. female admitted for Hypotension  Hypotension due to hypovolemia.    Patient complains of:      Patient is a 62 yr old female with CAD s/p PCI to LAD in November 2018- presents with chest tightness- start around 11 am- continuous with no significant radiation. No associated diaphoresis. no syncope.    EKG showed normal sinus rhythm with no acute st-t changes.  Chest pain reproducible with palpation.        Cardiac risk factors: htn, dyslipidemia         Past Medical History:   No past medical history on file.      Social History:     Social History     Socioeconomic History   . Marital status: MARRIED     Spouse name: Not on file   . Number of children: Not on file   . Years of education: Not on file   . Highest education level: Not on file        Family History:   No family history on file.     Medications:     Allergies   Allergen Reactions   . Morphine Hives and Itching   . Penicillins Rash   . Compazine [Prochlorperazine] Other (comments)     Jerking movements        Current Facility-Administered Medications   Medication Dose Route Frequency   . potassium chloride 10 mEq in 100 ml IVPB  10 mEq IntraVENous Q1H    . heparin 25,000 units in D5W 250 ml infusion  8-25 Units/kg/hr IntraVENous TITRATE   . NOREPINephrine (LEVOPHED) 8 mg in 5% dextrose 237m infusion  2-16 mcg/min IntraVENous TITRATE   . sodium chloride (NS) flush 5-40 mL  5-40 mL IntraVENous Q8H   . sodium chloride (NS) flush 5-40 mL  5-40 mL IntraVENous PRN   . hydroxychloroquine (PLAQUENIL) tablet 200 mg  200 mg Oral DAILY WITH BREAKFAST   . aspirin chewable tablet 81 mg  81 mg Oral DAILY   . famotidine (PF) (PEPCID) 20 mg in sodium chloride 0.9% 10 mL injection  20 mg IntraVENous Q12H   . [START ON 10/29/2017] thyroid (Pork) (ARMOUR) tablet 90 mg  90 mg Oral DAILY   . ticagrelor (BRILINTA) tablet 90 mg  90 mg Oral Q12H   . simvastatin (ZOCOR) tablet 20 mg  20 mg  Oral QHS   . pramipexole (MIRAPEX) tablet 1 mg  1 mg Oral QHS   . glucose chewable tablet 16 g  4 Tab Oral PRN   . glucagon (GLUCAGEN) injection 1 mg  1 mg IntraMUSCular PRN   . dextrose (D50W) injection syrg 12.5-25 g  25-50 mL IntraVENous PRN        Review Of Systems:       A comprehensive review of systems was negative except for that written in the HPI.    Constitutional: No fever, no chills, no weight loss, no night sweats   HEENT: No epistaxis, no nasal drainage, no difficulty in swallowing, no redness in eyes  Respiratory: negative for cough, sputum, hemoptysis, pleurisy/chest pain, wheezing, dyspnea on exertion or emphysema  Cardiovascular:  no dyspnea, no pnd, no claudication no palpitations, no chronic leg edema, no syncope  Gastrointestinal: no abd pain, no vomiting, no diarrhea, no bleeding symptoms  Genitourinary: No urinary symptoms or hematuria  Integument/breast: No ulcers or rashes  Musculoskeletal: no muscle pain, no weakness  Neurological: No focal weakness, no seizures, no headaches  Behvioral/Psych: No anxiety, no depression             Physical Exam:     Visit Vitals  BP 139/89   Pulse 85   Temp 97.9 F (36.6 C)   Resp 16   Ht _0  (1.651 m)   Wt 112.9 kg (249 lb)   SpO2 100%    BMI 41.44 kg/m     BP Readings from Last 3 Encounters:   10/28/17 139/89     Pulse Readings from Last 3 Encounters:   10/28/17 85     Wt Readings from Last 3 Encounters:   10/28/17 112.9 kg (249 lb)       General:  alert, cooperative, no distress, appears stated age, tenderness to palpation to the chest  Skin: Warm and dry, acyanotic, normal color.  Head: Normocephalic, atraumatic.    Eyes: Sclerae anicteric, conjunctivae without injection.  Neck:  nontender, no nuchal rigidity, no masses, no stridor, no carotid bruit, no JVD  Lungs:  clear to auscultation bilaterally,no  rhonchi ,   Heart:  regular rate and rhythm, S1, S2 normal, no murmur, click, rub or gallop  Abdomen:  abdomen is soft without significant tenderness, masses, organomegaly or guarding  Extremities:  extremities normal, atraumatic, no cyanosis or edema. Peripheral pulses   Neurological: grossly intact. No focal abnormalities, moves all extremities well.  Psychiatric Affect: The patient is awake, alert and oriented x3. Forde Dandy is interactive and appropriate.   Data Review:     Recent Results (from the past 48 hour(s))   EKG, 12 LEAD, INITIAL    Collection Time: 10/27/17  1:59 PM   Result Value Ref Range    Ventricular Rate 81 BPM    Atrial Rate 81 BPM    P-R Interval 172 ms    QRS Duration 86 ms    Q-T Interval 410 ms    QTC Calculation (Bezet) 476 ms    Calculated P Axis 36 degrees    Calculated R Axis 75 degrees    Calculated T Axis 69 degrees    Diagnosis       Sinus rhythm with premature atrial complexes  Otherwise normal ECG  When compared with ECG of 13-Mar-2010 14:28,  premature atrial complexes are now present  Confirmed by Fredirick Maudlin [phone]) on 10/28/2017 9:35:35 AM     CBC WITH AUTOMATED DIFF    Collection Time:  10/27/17  2:10 PM   Result Value Ref Range    WBC 6.2 4.6 - 13.2 K/uL    RBC 3.65 (L) 4.20 - 5.30 M/uL    HGB 11.2 (L) 12.0 - 16.0 g/dL    HCT 33.9 (L) 35.0 - 45.0 %    MCV 92.9 74.0 - 97.0 FL    MCH 30.7 24.0 -  34.0 PG    MCHC 33.0 31.0 - 37.0 g/dL    RDW 14.5 11.6 - 14.5 %    PLATELET 187 135 - 420 K/uL    MPV 12.0 (H) 9.2 - 11.8 FL    NEUTROPHILS 78 (H) 40 - 73 %    LYMPHOCYTES 13 (L) 21 - 52 %    MONOCYTES 8 3 - 10 %    EOSINOPHILS 1 0 - 5 %    BASOPHILS 0 0 - 2 %    ABS. NEUTROPHILS 4.9 1.8 - 8.0 K/UL    ABS. LYMPHOCYTES 0.8 (L) 0.9 - 3.6 K/UL    ABS. MONOCYTES 0.5 0.05 - 1.2 K/UL    ABS. EOSINOPHILS 0.1 0.0 - 0.4 K/UL    ABS. BASOPHILS 0.0 0.0 - 0.1 K/UL    DF AUTOMATED     METABOLIC PANEL, BASIC    Collection Time: 10/27/17  2:10 PM   Result Value Ref Range    Sodium 143 136 - 145 mmol/L    Potassium 3.9 3.5 - 5.5 mmol/L    Chloride 113 (H) 100 - 108 mmol/L    CO2 22 21 - 32 mmol/L    Anion gap 8 3.0 - 18 mmol/L    Glucose 78 74 - 99 mg/dL    BUN 22 (H) 7.0 - 18 MG/DL    Creatinine 1.53 (H) 0.6 - 1.3 MG/DL    BUN/Creatinine ratio 14 12 - 20      GFR est AA 42 (L) >60 ml/min/1.29m    GFR est non-AA 35 (L) >60 ml/min/1.741m   Calcium 7.4 (L) 8.5 - 10.1 MG/DL   TROPONIN I    Collection Time: 10/27/17  2:10 PM   Result Value Ref Range    Troponin-I, QT <0.02 0.0 - 0.045 NG/ML   EKG, 12 LEAD, SUBSEQUENT    Collection Time: 10/27/17  3:14 PM   Result Value Ref Range    Ventricular Rate 71 BPM    Atrial Rate 71 BPM    P-R Interval 172 ms    QRS Duration 86 ms    Q-T Interval 440 ms    QTC Calculation (Bezet) 478 ms    Calculated P Axis 28 degrees    Calculated R Axis 69 degrees    Calculated T Axis 68 degrees    Diagnosis       Normal sinus rhythm with sinus arrhythmia  Normal ECG  When compared with ECG of 27-Oct-2017 13:59,  premature atrial complexes are no longer present  Confirmed by VeFredirick [phone] 10/28/2017 9:35:04 AM     CBC WITH AUTOMATED DIFF    Collection Time: 10/27/17  4:25 PM   Result Value Ref Range    WBC 8.1 4.6 - 13.2 K/uL    RBC 3.84 (L) 4.20 - 5.30 M/uL    HGB 11.6 (L) 12.0 - 16.0 g/dL    HCT 35.8 35.0 - 45.0 %    MCV 93.2 74.0 - 97.0 FL    MCH 30.2 24.0 - 34.0 PG    MCHC 32.4 31.0 -  37.0 g/dL  RDW 14.5 11.6 - 14.5 %    PLATELET 203 135 - 420 K/uL    MPV 12.4 (H) 9.2 - 11.8 FL    NEUTROPHILS 75 (H) 40 - 73 %    LYMPHOCYTES 18 (L) 21 - 52 %    MONOCYTES 6 3 - 10 %    EOSINOPHILS 1 0 - 5 %    BASOPHILS 0 0 - 2 %    ABS. NEUTROPHILS 6.0 1.8 - 8.0 K/UL    ABS. LYMPHOCYTES 1.5 0.9 - 3.6 K/UL    ABS. MONOCYTES 0.5 0.05 - 1.2 K/UL    ABS. EOSINOPHILS 0.1 0.0 - 0.4 K/UL    ABS. BASOPHILS 0.0 0.0 - 0.1 K/UL    DF AUTOMATED     PTT    Collection Time: 10/27/17  4:25 PM   Result Value Ref Range    aPTT 82.3 (H) 23.0 - 36.4 SEC   TROPONIN I    Collection Time: 10/27/17  4:25 PM   Result Value Ref Range    Troponin-I, QT <0.02 0.0 - 0.045 NG/ML   TYPE & SCREEN    Collection Time: 10/27/17  4:25 PM   Result Value Ref Range    Crossmatch Expiration 10/30/2017     ABO/Rh(D) B POSITIVE     Antibody screen NEG    EKG, 12 LEAD, INITIAL    Collection Time: 10/27/17  5:42 PM   Result Value Ref Range    Ventricular Rate 76 BPM    Atrial Rate 76 BPM    P-R Interval 188 ms    QRS Duration 92 ms    Q-T Interval 448 ms    QTC Calculation (Bezet) 504 ms    Calculated P Axis 67 degrees    Calculated R Axis 84 degrees    Calculated T Axis 83 degrees    Diagnosis       Sinus rhythm with premature atrial complexes  Prolonged QT  Abnormal ECG  When compared with ECG of 27-Oct-2017 15:14,  premature atrial complexes are now present  Confirmed by Fredirick Maudlin [phone]) on 10/28/2017 1:66:06 AM     METABOLIC PANEL, BASIC    Collection Time: 10/27/17  6:24 PM   Result Value Ref Range    Sodium 141 136 - 145 mmol/L    Potassium 4.0 3.5 - 5.5 mmol/L    Chloride 110 (H) 100 - 108 mmol/L    CO2 21 21 - 32 mmol/L    Anion gap 10 3.0 - 18 mmol/L    Glucose 75 74 - 99 mg/dL    BUN 23 (H) 7.0 - 18 MG/DL    Creatinine 1.25 0.6 - 1.3 MG/DL    BUN/Creatinine ratio 18 12 - 20      GFR est AA 53 (L) >60 ml/min/1.65m    GFR est non-AA 44 (L) >60 ml/min/1.766m   Calcium 7.6 (L) 8.5 - 10.1 MG/DL   CBC WITH AUTOMATED DIFF    Collection  Time: 10/27/17  6:24 PM   Result Value Ref Range    WBC 6.4 4.6 - 13.2 K/uL    RBC 3.76 (L) 4.20 - 5.30 M/uL    HGB 11.5 (L) 12.0 - 16.0 g/dL    HCT 34.8 (L) 35.0 - 45.0 %    MCV 92.6 74.0 - 97.0 FL    MCH 30.6 24.0 - 34.0 PG    MCHC 33.0 31.0 - 37.0 g/dL    RDW 14.5 11.6 - 14.5 %  PLATELET 190 135 - 420 K/uL    MPV 12.3 (H) 9.2 - 11.8 FL    NEUTROPHILS 73 40 - 73 %    LYMPHOCYTES 18 (L) 21 - 52 %    MONOCYTES 8 3 - 10 %    EOSINOPHILS 1 0 - 5 %    BASOPHILS 0 0 - 2 %    ABS. NEUTROPHILS 4.7 1.8 - 8.0 K/UL    ABS. LYMPHOCYTES 1.1 0.9 - 3.6 K/UL    ABS. MONOCYTES 0.5 0.05 - 1.2 K/UL    ABS. EOSINOPHILS 0.1 0.0 - 0.4 K/UL    ABS. BASOPHILS 0.0 0.0 - 0.1 K/UL    DF AUTOMATED     CULTURE, BLOOD    Collection Time: 10/27/17  6:24 PM   Result Value Ref Range    Special Requests: NO SPECIAL REQUESTS      Culture result: NO GROWTH AFTER 12 HOURS     POC LACTIC ACID    Collection Time: 10/27/17  6:25 PM   Result Value Ref Range    Lactic Acid (POC) 0.38 (L) 0.40 - 2.00 mmol/L   PTT    Collection Time: 10/27/17  6:33 PM   Result Value Ref Range    aPTT 32.6 23.0 - 36.4 SEC   CULTURE, BLOOD    Collection Time: 10/27/17 10:00 PM   Result Value Ref Range    Special Requests: NO SPECIAL REQUESTS      Culture result: NO GROWTH AFTER 7 HOURS     HEPATIC FUNCTION PANEL    Collection Time: 10/27/17 10:30 PM   Result Value Ref Range    Protein, total 6.1 (L) 6.4 - 8.2 g/dL    Albumin 3.4 3.4 - 5.0 g/dL    Globulin 2.7 2.0 - 4.0 g/dL    A-G Ratio 1.3 0.8 - 1.7      Bilirubin, total 0.4 0.2 - 1.0 MG/DL    Bilirubin, direct 0.1 0.0 - 0.2 MG/DL    Alk. phosphatase 73 45 - 117 U/L    AST (SGOT) 12 (L) 15 - 37 U/L    ALT (SGPT) 19 13 - 56 U/L   MAGNESIUM    Collection Time: 10/27/17 10:30 PM   Result Value Ref Range    Magnesium 2.0 1.6 - 2.6 mg/dL   CALCIUM, IONIZED    Collection Time: 10/27/17 10:30 PM   Result Value Ref Range    Ionized Calcium 1.16 1.12 - 1.32 MMOL/L   TSH 3RD GENERATION    Collection Time: 10/27/17 10:30 PM   Result  Value Ref Range    TSH 18.30 (H) 0.36 - 3.74 uIU/mL   T4, FREE    Collection Time: 10/27/17 10:30 PM   Result Value Ref Range    T4, Free 0.6 (L) 0.7 - 1.5 NG/DL   NT-PRO BNP    Collection Time: 10/27/17 10:30 PM   Result Value Ref Range    NT pro-BNP 135 0 - 900 PG/ML   PTT    Collection Time: 10/27/17 10:30 PM   Result Value Ref Range    aPTT 35.3 23.0 - 36.4 SEC   PTT    Collection Time: 10/28/17  4:00 AM   Result Value Ref Range    aPTT 86.5 (H) 23.0 - 36.4 SEC   CBC WITH AUTOMATED DIFF    Collection Time: 10/28/17  4:00 AM   Result Value Ref Range    WBC 5.8 4.6 - 13.2 K/uL    RBC 3.62 (L)  4.20 - 5.30 M/uL    HGB 11.0 (L) 12.0 - 16.0 g/dL    HCT 33.6 (L) 35.0 - 45.0 %    MCV 92.8 74.0 - 97.0 FL    MCH 30.4 24.0 - 34.0 PG    MCHC 32.7 31.0 - 37.0 g/dL    RDW 14.6 (H) 11.6 - 14.5 %    PLATELET 189 135 - 420 K/uL    MPV 12.5 (H) 9.2 - 11.8 FL    NEUTROPHILS 66 40 - 73 %    LYMPHOCYTES 23 21 - 52 %    MONOCYTES 8 3 - 10 %    EOSINOPHILS 2 0 - 5 %    BASOPHILS 1 0 - 2 %    ABS. NEUTROPHILS 3.8 1.8 - 8.0 K/UL    ABS. LYMPHOCYTES 1.3 0.9 - 3.6 K/UL    ABS. MONOCYTES 0.5 0.05 - 1.2 K/UL    ABS. EOSINOPHILS 0.1 0.0 - 0.4 K/UL    ABS. BASOPHILS 0.0 0.0 - 0.1 K/UL    DF AUTOMATED     METABOLIC PANEL, BASIC    Collection Time: 10/28/17  4:00 AM   Result Value Ref Range    Sodium 142 136 - 145 mmol/L    Potassium 3.4 (L) 3.5 - 5.5 mmol/L    Chloride 112 (H) 100 - 108 mmol/L    CO2 22 21 - 32 mmol/L    Anion gap 8 3.0 - 18 mmol/L    Glucose 86 74 - 99 mg/dL    BUN 18 7.0 - 18 MG/DL    Creatinine 0.88 0.6 - 1.3 MG/DL    BUN/Creatinine ratio 20 12 - 20      GFR est AA >60 >60 ml/min/1.70m    GFR est non-AA >60 >60 ml/min/1.762m   Calcium 7.9 (L) 8.5 - 10.1 MG/DL   CALCIUM, IONIZED    Collection Time: 10/28/17  4:00 AM   Result Value Ref Range    Ionized Calcium 1.19 1.12 - 1.32 MMOL/L   MAGNESIUM    Collection Time: 10/28/17  4:00 AM   Result Value Ref Range    Magnesium 2.2 1.6 - 2.6 mg/dL   PHOSPHORUS    Collection Time:  10/28/17  4:00 AM   Result Value Ref Range    Phosphorus 2.9 2.5 - 4.9 MG/DL   EKG, 12 LEAD, SUBSEQUENT    Collection Time: 10/28/17  7:11 AM   Result Value Ref Range    Ventricular Rate 80 BPM    Atrial Rate 80 BPM    P-R Interval 192 ms    QRS Duration 90 ms    Q-T Interval 386 ms    QTC Calculation (Bezet) 445 ms    Calculated P Axis 49 degrees    Calculated R Axis 84 degrees    Calculated T Axis 79 degrees    Diagnosis       Sinus rhythm with premature atrial complexes  Otherwise normal ECG  When compared with ECG of 27-Oct-2017 17:42,  QT has shortened  Confirmed by VeFredirick [phone] 10/28/2017 9:30:59 AM     GLUCOSE, POC    Collection Time: 10/28/17 12:26 PM   Result Value Ref Range    Glucose (POC) 70 70 - 110 mg/dL         Intake/Output Summary (Last 24 hours) at 10/28/2017 1325  Last data filed at 10/28/2017 1153  Gross per 24 hour   Intake 2775.88 ml   Output 1400 ml  Net 1375.88 ml       Cardiographics:     ECG: normal EKG, normal sinus rhythm    Echocardiogram: 08/2017:  NORMAL GLOBAL LEFT VENTRICULAR SYSTOLIC FUNCTION.   EJECTION FRACTION 65% BY SIMPSON'S.   STAGE I (MILD) DIASTOLIC DYSFUNCTION.   NO HEMODYNAMICALLY SIGNIFICANT VALVULAR PATHOLOGY.     PREVIOUS ECHO DATED 04/03/14 SHOWED:   1)EJECTION FRACTION 60%.   2)RVSP 23 MMHG      Signed By: Fredirick Maudlin MD    October 28, 2017

## 2017-10-27 NOTE — ED Provider Notes (Signed)
EMERGENCY DEPARTMENT HISTORY AND PHYSICAL EXAM    2:13 PM      Date: 10/27/2017  Patient Name: [name] [name]    History of Presenting Illness     Chief Complaint   Patient presents with   ??? Chest Pain         History Provided By: patient    Chief Complaint: chest pressure  Duration:  3hours  Timing:  constant  Location: middle of chest, rad to back  Quality: pressure  Severity: 10/10 at worst, 6/10 now  Modifying Factors: none  Associated Symptoms: some nausea and dizzy this morning.       Additional History (Context): [name] DONALSON is a 62 y.o. 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  Discomfort is same location and radiation as prior mi but mi had frank pain, this is pressure.  Marland Kitchen    PCP: None    Current Facility-Administered Medications   Medication Dose Route Frequency Provider Last Rate Last Dose   ??? heparin 25,000 units in D5W 250 ml infusion  8-25 Units/kg/hr IntraVENous TITRATE Halecki, Amparo E, NP   Stopped at 10/27/17 1426   ??? NOREPINephrine (LEVOPHED) 8 mg in 5% dextrose 270mL infusion  2-16 mcg/min IntraVENous TITRATE Freddie Apley, MD   Stopped at 10/27/17 1732       Past History     Past Medical History:  No past medical history on file.    Past Surgical History:  No past surgical history on file.    Family History:  No family history on file.    Social History:  Social History     Tobacco Use   ??? Smoking status: Not on file   Substance Use Topics   ??? Alcohol use: Not on file   ??? Drug use: Not on file       Allergies:  Allergies   Allergen Reactions   ??? Morphine Hives and Itching   ??? Penicillins Rash   ??? Compazine [Prochlorperazine] Other (comments)     Jerking movements         Review of Systems     Review of Systems   Constitutional: Negative for diaphoresis and fever.   HENT: Negative for congestion and sore throat.     Eyes: Negative for pain and itching.   Respiratory: Negative for cough and shortness of breath.    Cardiovascular: Positive for chest pain. Negative for palpitations.   Gastrointestinal: Negative for abdominal pain and diarrhea.   Endocrine: Negative for polydipsia and polyuria.   Genitourinary: Negative for dysuria and hematuria.   Musculoskeletal: Negative for arthralgias and myalgias.   Skin: Negative for rash and wound.   Neurological: Negative for seizures and syncope.   Hematological: Does not bruise/bleed easily.   Psychiatric/Behavioral: Negative for agitation and hallucinations.         Physical Exam     Patient Vitals for the past 12 hrs:   Pulse Resp BP SpO2   10/27/17 1730 67 15 100/58 99 %   10/27/17 1725 69 17 107/62 99 %   10/27/17 1720 72 21 104/60 99 %   10/27/17 1719 73 25 103/63 98 %   10/27/17 1712 73 ??? 96/52 96 %   10/27/17 1630 74 16 94/61 98 %   10/27/17 1617 70 13 (!) 88/52 99 %   10/27/17 1615  70 13 (!) 87/49 98 %   10/27/17 1545 ??? ??? 97/60 ???   10/27/17 1544 ??? ??? 98/56 ???   10/27/17 1530 ??? ??? (!) 72/27 ???   10/27/17 1515 ??? ??? (!) 90/37 ???   10/27/17 1500 ??? ??? (!) 89/50 ???   10/27/17 1437 ??? ??? ??? 98 %   10/27/17 1415 86 22 (!) 88/55 98 %   10/27/17 1400 83 19 (!) 88/55 98 %         Physical Exam   Constitutional: She appears well-developed and well-nourished.   Appears uncomfortable   HENT:   Head: Normocephalic and atraumatic.   Eyes: Conjunctivae are normal. No scleral icterus.   Neck: Normal range of motion. Neck supple. No JVD present.   Cardiovascular: Normal rate, regular rhythm and intact distal pulses.   Pulmonary/Chest: Effort normal. No respiratory distress.   Musculoskeletal: Normal range of motion.   Neurological: She is alert.   Skin: Skin is warm and dry.   Psychiatric: Judgment and thought content normal.   Nursing note and vitals reviewed.        Diagnostic Study Results   Labs -  Recent Results (from the past 12 hour(s))   EKG, 12 LEAD, INITIAL    Collection Time: 10/27/17  1:59 PM    Result Value Ref Range    Ventricular Rate 81 BPM    Atrial Rate 81 BPM    P-R Interval 172 ms    QRS Duration 86 ms    Q-T Interval 410 ms    QTC Calculation (Bezet) 476 ms    Calculated P Axis 36 degrees    Calculated R Axis 75 degrees    Calculated T Axis 69 degrees    Diagnosis       Sinus rhythm with premature atrial complexes  Otherwise normal ECG  When compared with ECG of 13-Mar-2010 14:28,  premature atrial complexes are now present     CBC WITH AUTOMATED DIFF    Collection Time: 10/27/17  2:10 PM   Result Value Ref Range    WBC 6.2 4.6 - 13.2 K/uL    RBC 3.65 (L) 4.20 - 5.30 M/uL    HGB 11.2 (L) 12.0 - 16.0 g/dL    HCT 33.9 (L) 35.0 - 45.0 %    MCV 92.9 74.0 - 97.0 FL    MCH 30.7 24.0 - 34.0 PG    MCHC 33.0 31.0 - 37.0 g/dL    RDW 14.5 11.6 - 14.5 %    PLATELET 187 135 - 420 K/uL    MPV 12.0 (H) 9.2 - 11.8 FL    NEUTROPHILS 78 (H) 40 - 73 %    LYMPHOCYTES 13 (L) 21 - 52 %    MONOCYTES 8 3 - 10 %    EOSINOPHILS 1 0 - 5 %    BASOPHILS 0 0 - 2 %    ABS. NEUTROPHILS 4.9 1.8 - 8.0 K/UL    ABS. LYMPHOCYTES 0.8 (L) 0.9 - 3.6 K/UL    ABS. MONOCYTES 0.5 0.05 - 1.2 K/UL    ABS. EOSINOPHILS 0.1 0.0 - 0.4 K/UL    ABS. BASOPHILS 0.0 0.0 - 0.1 K/UL    DF AUTOMATED     METABOLIC PANEL, BASIC    Collection Time: 10/27/17  2:10 PM   Result Value Ref Range    Sodium 143 136 - 145 mmol/L    Potassium 3.9 3.5 - 5.5 mmol/L    Chloride 113 (H) 100 - 108  mmol/L    CO2 22 21 - 32 mmol/L    Anion gap 8 3.0 - 18 mmol/L    Glucose 78 74 - 99 mg/dL    BUN 22 (H) 7.0 - 18 MG/DL    Creatinine 1.53 (H) 0.6 - 1.3 MG/DL    BUN/Creatinine ratio 14 12 - 20      GFR est AA 42 (L) >60 ml/min/1.56m2    GFR est non-AA 35 (L) >60 ml/min/1.79m2    Calcium 7.4 (L) 8.5 - 10.1 MG/DL   TROPONIN I    Collection Time: 10/27/17  2:10 PM   Result Value Ref Range    Troponin-I, QT <0.02 0.0 - 0.045 NG/ML   EKG, 12 LEAD, SUBSEQUENT    Collection Time: 10/27/17  3:14 PM   Result Value Ref Range    Ventricular Rate 71 BPM    Atrial Rate 71 BPM     P-R Interval 172 ms    QRS Duration 86 ms    Q-T Interval 440 ms    QTC Calculation (Bezet) 478 ms    Calculated P Axis 28 degrees    Calculated R Axis 69 degrees    Calculated T Axis 68 degrees    Diagnosis       Normal sinus rhythm with sinus arrhythmia  Normal ECG  When compared with ECG of 27-Oct-2017 13:59,  premature atrial complexes are no longer present     CBC WITH AUTOMATED DIFF    Collection Time: 10/27/17  4:25 PM   Result Value Ref Range    WBC 8.1 4.6 - 13.2 K/uL    RBC 3.84 (L) 4.20 - 5.30 M/uL    HGB 11.6 (L) 12.0 - 16.0 g/dL    HCT 35.8 35.0 - 45.0 %    MCV 93.2 74.0 - 97.0 FL    MCH 30.2 24.0 - 34.0 PG    MCHC 32.4 31.0 - 37.0 g/dL    RDW 14.5 11.6 - 14.5 %    PLATELET 203 135 - 420 K/uL    MPV 12.4 (H) 9.2 - 11.8 FL    NEUTROPHILS 75 (H) 40 - 73 %    LYMPHOCYTES 18 (L) 21 - 52 %    MONOCYTES 6 3 - 10 %    EOSINOPHILS 1 0 - 5 %    BASOPHILS 0 0 - 2 %    ABS. NEUTROPHILS 6.0 1.8 - 8.0 K/UL    ABS. LYMPHOCYTES 1.5 0.9 - 3.6 K/UL    ABS. MONOCYTES 0.5 0.05 - 1.2 K/UL    ABS. EOSINOPHILS 0.1 0.0 - 0.4 K/UL    ABS. BASOPHILS 0.0 0.0 - 0.1 K/UL    DF AUTOMATED     PTT    Collection Time: 10/27/17  4:25 PM   Result Value Ref Range    aPTT 82.3 (H) 23.0 - 36.4 SEC   TROPONIN I    Collection Time: 10/27/17  4:25 PM   Result Value Ref Range    Troponin-I, QT <0.02 0.0 - 0.045 NG/ML       Radiologic Studies -   XR CHEST PORT   Final Result   Impression:    Right IJ central venous line extends to the peripheral SVC. No pneumothorax.               XR CHEST PORT   Final Result   Impression:       Exam within normal limits.  CTA CHEST ABD PELV W CONT    (Results Pending)     Xr Chest Port    Result Date: 10/27/2017  Description:  Portable supine chest radiograph, single view Clinical Indication:  Line placement. Check position. Comparison: October 27, 2017. Findings:  There is been interval placement of a right IJ central venous line which extends to the peripheral SVC. No pneumothorax is seen. No  focal airspace opacity or effusion. Cardiomediastinal silhouette within normal limits..     Impression: Right IJ central venous line extends to the peripheral SVC. No pneumothorax.     Xr Chest Port    Result Date: 10/27/2017  Description:  Portable upright chest radiograph single view, Clinical Indication:  Chest pain and shortness of breath Comparison: March 13, 2010. Findings:  The lungs and costophrenic angles are clear. Cardiac and mediastinal silhouette is within normal limits. No focal airspace opacity or effusion. Cardiomediastinal silhouette within normal limits.     Impression: Exam within normal limits.       Medications ordered:   Medications   heparin 25,000 units in D5W 250 ml infusion (0 Units/kg/hr ?? 112.9 kg IntraVENous Held 10/27/17 1426)   NOREPINephrine (LEVOPHED) 8 mg in 5% dextrose 258mL infusion (0 mcg/min IntraVENous IV Completed 10/27/17 1732)   sodium chloride 0.9 % bolus infusion 1,000 mL (0 mL IntraVENous IV Completed 10/27/17 1428)   heparin (porcine) 1,000 unit/mL injection 4,000 Units (4,000 Units IntraVENous Given 10/27/17 1444)   HYDROmorphone (PF) (DILAUDID) injection 0.5 mg (0.5 mg IntraVENous Given 10/27/17 1443)   sodium chloride 0.9 % bolus infusion 1,000 mL (0 mL IntraVENous IV Completed 10/27/17 1645)   iopamidol (ISOVUE-370) 76 % injection 75-100 mL (100 mL IntraVENous Given 10/27/17 1716)         Medical Decision Making   Initial Medical Decision Making and DDx:  2:13 PM concern for UAP.  D/w Ampi NP with cardiology asked to see in ED.  Reviewed story which is concerning for ACS.  Other thought is possible PE, will coordinate with cardiology as want to avoid large load of contrast CTA vs cath. Asprin and nitro given by ems.  Now hypotensive so will bolus and hold additional nitro.  Morphine for cp.     ED Course: Progress Notes, Reevaluation, and Consults:  ED Course as of Oct 27 1734   Tue Oct 27, 2017   1428 Dr Elgie Collard came and saw pt, IV NTG if tolerated, morphine, will  follow. PT still having pain.   [CB]      ED Course User Index  [CB] Freddie Apley, MD     Central Line  Date/Time: 10/27/2017 4:32 PM  Performed by: Freddie Apley, MD  Authorized by: Freddie Apley, MD     Consent:     Consent obtained:  Emergent situation    Consent given by:  Patient  Pre-procedure details:     Hand hygiene: Hand hygiene performed prior to insertion      Sterile barrier technique: All elements of maximal sterile technique followed      Skin preparation:  2% chlorhexidine  Anesthesia (see MAR for exact dosages):     Anesthesia method:  Local infiltration    Local anesthetic:  Lidocaine 1% w/o epi  Procedure details:     Location:  R internal jugular    Patient position:  Trendelenburg    Procedural supplies:  Triple lumen    Catheter size:  7.5 Fr    Landmarks identified: yes  Ultrasound guidance: yes      Sterile ultrasound techniques: Sterile gel and sterile probe covers were used      Number of attempts:  1    Successful placement: yes    Post-procedure details:     Post-procedure:  Dressing applied and line sutured    Assessment:  Blood return through all ports, placement verified by x-ray and no pneumothorax on x-ray    Patient tolerance of procedure:  Tolerated well, no immediate complications    1:06 PM pt has been profoundly hypotensive, despite almost 2 l fluid.  Holding heparin, concerned for aortic pathology.  PE still possible.  Consider ACS less likely at this point.  We went to CT scan but suitable line blew, no good candidate found for a new line so back to ER and emergently placed right IJ without complication.  Now going back to CT.  Doubt sepsis at this point.   5:37 PM pt back from CT, was on levo for a while but pressure is holding now with map >65 so she's off levo.  I d/w MCR to watch for CT and cll [place] with result.     5:45 PM : Pt care transferred to Dr. Sallee Provencal  ,ED provider. History of  patient complaint(s), available diagnostic reports and current treatment plan has been discussed thoroughly.   Bedside rounding on patient occured : yes .  Intended disposition of patient : admit  Pending diagnostics reports and/or labs (please list): ct chest abd pel, monitor bp     5:51 PM Dr Samella Parr no pe dissection or aneurysm. I questions patient about accidental overdose of c- channel or beta blockers and she denies, very careful with her meds.     I am the first provider for this patient.    I reviewed the vital signs, available nursing notes, past medical history, past surgical history, family history and social history.    Vital Signs-Reviewed the patient's vital signs.    Pulse Oximetry Analysis - 98% RA    Cardiac Monitor:  Rate/Rhythm:  Sinus rhythm 82    EKG:Interpreted by the EP.   Time Interpreted: 2694   Rate: 81   Rhythm: sinus rhythm   Interpretation: no acute process    ekg #2  1514  71 sinus rhythm  No acute process, no change    Records Reviewed: Old Medical Records (Time of Review: 2:13 PM)    3:19 PM no acute process on cxr read by me    For Hospitalized Patients:    2. Aspirin: Aspirin was given    Diagnosis     Clinical Impression:   1. Chest pain, unspecified type      Disposition: tbd    Follow-up Information    None             Medication List      You have not been prescribed any medications.       _______________________________    Attestations:  Scribe Attestation     Freddie Apley, MD acting as a scribe for and in the presence of Freddie Apley, MD      October 27, 2017 at 5:36 PM       Provider Attestation:      I personally performed the services described in the documentation, reviewed the documentation, as recorded by the scribe in my presence, and it accurately and completely records my words and actions. October 27, 2017 at 5:36 PM - Freddie Apley, MD  _______________________________

## 2017-10-27 NOTE — Progress Notes (Signed)
Pt was brought over to CT with a nonworking 20g IV.

## 2017-10-27 NOTE — ED Notes (Signed)
5:51 PM :Pt care assumed from Dr. Trudee Kuster , ED provider. Pt complaint(s), current treatment plan, progression and available diagnostic results have been discussed thoroughly.   Rounding occurred: Yes   Intended Disposition: TBD   Pending diagnostic reports and/or labs (please list): CTA      Progress Notes:     5:50 PM: CTA chest reports shows 'no PE, no aneurysm, nor dissection; calcified granuloma LLL and spleen'    5:51 PM: Dr. Charisse Klinefelter coming to see the pt.     6:16 PM Consult:  Discussed care with Dr. Charisse Klinefelter, Specialty: Cardiology Standard discussion; including history of patient???s chief complaint, available diagnostic results, and treatment course. Recommends the pt be admitted to the ICU and given Levophed to keep MAP 65. Suspects hypotension caused the pt's chest pain.    6:32 PM Consult:  Discussed care with Dr. Posey Pronto, Specialty: Intensivist  Standard discussion; including history of patient???s chief complaint, available diagnostic results, and treatment course. Accepts for admission to the ICU.        Disposition:   Admission      Attestations:    [place] acting as a Education administrator for and in the presence of Garfield Cornea, DO.       October 27, 2017 at 5:51 PM       Provider Attestation:      I personally performed the services described in the documentation, reviewed the documentation, as recorded by the scribe in my presence, and it accurately and completely records my words and actions. October 27, 2017 at 5:51 PM - Garfield Cornea, DO.

## 2017-10-27 NOTE — ED Notes (Signed)
MD made aware of map increasing over 65 and DC of norepi completed. Pt states "my normal blood pressure is only like 035 systolic. That's why after my heart attack, they kept me so much longer because I kept passing out."

## 2017-10-27 NOTE — ED Notes (Signed)
Floor refusing report stating "we will not take this patient until the other patient comes up." charge nurse made awre

## 2017-10-27 NOTE — ED Triage Notes (Signed)
Patient arrived via EMS c/o of substernal chest pain that radiates to her jaw and her left armpit. Patient states that she was seen at [facility] 6 weeks ago for a MI. Per EMS, she was given 324 mg chewable aspirin and one nitro. EMS stated that they weren't able to give a second nitro due to her blood pressure dropping from 834 systolic to 86 systolic. Patient stated that her pain before the nitro was 10/10 and after the nitro a 6-7/10.

## 2017-10-27 NOTE — ED Notes (Signed)
Pt lost IV access in CT, MD at bedside completing central line at this time.

## 2017-10-27 NOTE — Other (Signed)
TRANSFER - OUT REPORT:    Verbal report given to Stephaney(name) on [name] [name]  being transferred to ICU(unit) for routine progression of care       Report consisted of patient???s Situation, Background, Assessment and   Recommendations(SBAR).     Information from the following report(s) SBAR and ED Summary was reviewed with the receiving nurse.    Lines:   Triple Lumen 10/27/17 Right Internal jugular (Active)   Central Line Being Utilized Yes 10/27/2017  4:31 PM   Site Assessment Clean, dry, & intact 10/27/2017  4:31 PM   Infiltration Assessment 0 10/27/2017  4:31 PM   Dressing Status Clean, dry, & intact 10/27/2017  4:31 PM   Positive Blood Return (Medial Site) Yes 10/27/2017  4:31 PM   Positive Blood Return (Lateral Site) Yes 10/27/2017  4:31 PM   Positive Blood Return (Site #3) Yes 10/27/2017  4:31 PM        Opportunity for questions and clarification was provided.      Patient transported with:   Monitor  Registered Nurse

## 2017-10-27 NOTE — H&P (Addendum)
[facility]  Pulmonary, Critical Care, and Sleep Medicine  Name: [name] [name] MRN: [id number]   DOB: January 12, 1956 Hospital: [facility]   Date: 10/27/2017        Critical Care History & Physical    IMPRESSION:   ?? Hypotension- improved to IVF, was on low dose levophed, likely component of hypovolemia from fluid restriction and medication induced (nitro,dilaudid)   ?? Chest Pain- demand ischemia vs. ACS/angina, on heparin gtt per cards   ?? Acute Kidney Injury- resolved with IVF, pre-renal   ?? Prolonged QTc  ?? Hx of CAD/STEMI with LAD DES placed 08/09/17  ?? Grade I Diastolic Dysfunction-echo 08/2017 EF 60%   ?? Mild Normocytic Anemia   ?? Rheumatoid Arthritis   ?? Hyperlipidemia   ?? Hypothyroidism   ?? Hx of Pseudotumor Cerebri      Patient Active Problem List   Diagnosis Code   ??? Hypotension I95.9      RECOMMENDATIONS:   ?? Resp:  Supplemental oxygen as needed. PRN CXR or ABG for respiratory distress.    ?? I/D: Trend WBC and Temp Curve. F/U blood cultures, no acute indication for ABX at this time.   ?? Hem/Onc: Trend H&H and platelets, PTT as per heparin protocol. Monitor closely for bleeding.   ?? CVS: Monitor Hemodynamics. Titrate levophed with MAP goal > 65. Hold PO anti-hypertensives for now. Continue Brilinta, statin, aspirin, and heparin gtt per Cardiology. Possible Cardiac Cath in AM.   ?? Metabolic: Trend lytes and replace per provider. BMP, Mg, Ca, Phos.   ?? Renal: Trend Cr and UOP. Strict I&Os.    ?? Endocrine: Monitor for hypoglycemia while NPO. Check TSH/T4. Restart Thyroid Hormones.   ?? GI: NPO for now, cardiac diet with fluid restriction after cath. Check hepatic panel.   ?? Musc/Skin: Continue Plaquenil.   ?? Neuro: Monitor neuro status.  ?? Code Status: Full Code     Best practice:  ?? Glycemic control  ?? IHI ICU Bundles:  ??  Central Line Bundle Followed     ?? Stress ulcer prophylaxis. Pepcid for GERD   ?? DVT prophylaxis. SCDs/On Heparin gtt     Subjective/History:   This patient has been seen and evaluated at the request of Dr. Binnie Rail for Hypotension. [name] [name] is a 62 y.o. female with pmhx of STEMI s/p DES of LAD, diastolic CHF, hypothyroidism, GERD, and RA who presnted to the [facility] ER on 10/27/17 with mid-sternal 10/10 chest pain radiating to her back that started 2 hrs PTA associated with worsening SOB. Pt went to bed at 9 pm the night before feeling normal, but woke up with dizziness and nausea this AM. Her AM symptoms worsened at work then her chest pain began around 11:00, pt BP 80/50 while at work and EMS was called. Pt was given aspirin, nitro, en route and there was a noted further drop in BP. Chest pain improved from 10/10 to 6/10 with nitro. Pt notes hx of labile and low BPs when her coreg was increased and imdur was started. She was hospitalized for a STEMI with stenting of LAD on 08/09/17 and was re-hospitalized on 09/04/18 due to an episode of chest pain and hypotension. Pt notes taking 2 nitro/day due to on-going angina that her Cardiologist is aware of. She eats a strict low Na diet and practices strict fluid restriction 1 L/day. Reports compliance with meds. Denies drug, and tobacco use. Drinks wine on occasion.     Upon arrival to  the ER, pt was 87/49 with HR 70 normal Temp RR 15 saturating 99% on RA. EKG showed NSR with PACs and prolonged QTc, troponin negative x 2. CTA Chest negative for PE, aortic dissection. Pt received 3 L IVF while in the ER, infectious work-up negative. CR was initially elevated, but improved with IVF. Pt remained mildly hypotensive while in the ER, CVL was placed in right IJ and pt was started on levophed at this time. Pt transferred to ICU for further management. Pt notes that her dyspnea has resolved. Chest pain now dull 2/10, dizziness has improved, but returns when standing. Pt notes mild HA.     PMHx:   ??? Pseudotumor cerebri   ??? 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 Cellulitis   ??? Restless leg syndrome    ??? Seronegative rheumatoid arthritis ([place])   ??? ANA positive   ??? STEMI (ST elevation myocardial infarction) ([place])   ??? Contusion of right arm   ??? Coronary artery disease involving native coronary artery of native heart with unstable angina pectoris ([place])   ??? Systolic CHF ([place])   ??? Chest pain   ??? Hyperlipidemia LDL goal <70   ??? Morbid obesity with BMI of 40.0-44.9, adult ([place])   ??? Chronic diastolic CHF (congestive heart failure) ([place])     No past surgical history on file.     ??? aspirin EC 81 mg PO TBEC Take 1 Tab by Mouth Once a Day.   ??? carvedilol (COREG) 6.25 mg PO TABS Take 1 Tab by Mouth 2 Times Daily with Meals. See patient instruction sheet on how to take. (Patient taking differently: Take 6.25 mg by Mouth 2 Times Daily with Meals. See patient instruction sheet on how to take. Indications: 1/2 tab in a.m. whole tab in p.m.) 60 Tab 12   ??? ergocalciferol (VITAMIN D2) 50,000 unit PO CAPS Take 50,000 Units by Mouth Every 7 Days.   ??? ESomeprazole (NEXIUM) 40 mg PO CPDR Take 40 mg by Mouth Once a Day.   ??? folic acid (FOLVITE) 1 mg PO TABS Take 1 Tab by Mouth Once a Day. 30 Tab 3   ??? furosemide (LASIX) 20 mg PO TABS Take 1 Tab by Mouth As Directed. (Patient taking differently: Take 20 mg by Mouth As Directed. Indications: monday, wednesday, friday) 20 Tab 3   ??? HYDROxychloroquine (PLAQUENIL) 200 mg PO TABS Take 2 Tabs by Mouth Once a Day. (Patient taking differently: Take 200 mg by Mouth Once a Day.) 60 Tab 5   ??? isosorbide mononitrate CR (IMDUR) 60 mg PO TB24 Take 1 Tab by Mouth Once a Day. 30 Tab 0   ??? lisinopril (PRINIVIL) 2.5 mg PO TABS Take 1 Tab by Mouth Once a Day. 60 Tab 1   ??? methotrexate 2.5 mg PO TABS Take 6 Tabs by Mouth Every 7 Days. 72 Tab 1   ??? nitroglycerin (NITROSTAT) 0.4 mg SL SUBL Dissolve 1 Tab under tongue Every 5 Minutes as Needed (Chest Pain). 25 Tab 1   ??? ondansetron (ZOFRAN ODT) 4 mg PO TbDL Take 1 Tab by Mouth Every 8 Hours As Needed for Nausea. 20 Tab 0    ??? oxyCODONE-acetaminophen (PERCOCET) 5-325 mg PO TABS Take 1 Tab by Mouth Every 4 Hours As Needed. 20 Tab 0   ??? pramipexole (MIRAPEX) 1 mg PO TABS Take 1 mg by Mouth every evening.   ??? simvastatin (ZOCOR) 20 mg PO TABS Take 20 mg by Mouth Every Night at Bedtime.   ???  Thyroid, Pork, (ARMOUR THYROID) 90 mg PO TABS Take 90 mg by Mouth Once a Day. 90 Tab 1   ??? ticagrelor (BRILINTA) 90 mg PO TABS Take 1 Tab by Mouth Twice Daily. 60 Tab 12   ??? traMADol (ULTRAM) 50 mg PO TABS Take 1 Tab by Mouth Every 6 Hours As Needed. For rheumatoid arthritis pain. 60 Tab 0     Current Facility-Administered Medications   Medication Dose Route Frequency   ??? heparin 25,000 units in D5W 250 ml infusion  8-25 Units/kg/hr IntraVENous TITRATE   ??? NOREPINephrine (LEVOPHED) 8 mg in 5% dextrose 280mL infusion  2-16 mcg/min IntraVENous TITRATE   ??? 0.9% sodium chloride infusion 1,000 mL  1,000 mL IntraVENous CONTINUOUS     Allergies   Allergen Reactions   ??? Morphine Hives and Itching   ??? Penicillins Rash   ??? Compazine [Prochlorperazine] Other (comments)     Jerking movements      Social Hx:    Never Smoker  Occasional Wine   Denies illegal drug use    Family Hx: Unknown    Review of Systems:  Constitutional: positive for fatigue, negative for fevers, chills, anorexia and weight loss  Eyes: negative for visual disturbance, irritation and redness  Ears, nose, mouth, throat, and face: negative for hearing loss, epistaxis, sore throat and voice change  Respiratory: negative for cough, sputum, hemoptysis, wheezing or dyspnea on exertion  Cardiovascular: positive for chest pressure/discomfort, dizziness, negative for palpitations, orthopnea, lower extremity edema  Gastrointestinal: negative for dysphagia, reflux symptoms, nausea, vomiting, melena, diarrhea, constipation and abdominal pain  Genitourinary:negative for frequency, dysuria and hematuria  Integument/breast: negative for rash, skin lesion(s) and pruritus   Hematologic/lymphatic: negative for easy bruising, bleeding and lymphadenopathy  Musculoskeletal:negative for myalgias, neck pain and back pain  Neurological: positive for headaches and dizziness, negative for paresthesia  Behavioral/Psych: negative for anxiety and depression  Endocrine: negative for diabetic symptoms including polyuria, polydipsia and polyphagia    Objective:  Vital Signs:    Visit Vitals  BP 106/58   Pulse 72   Temp 97.8 ??F (36.6 ??C)   Resp 20   Wt 112.9 kg (249 lb)   SpO2 99%               Temp (24hrs), Avg:98.1 ??F (36.7 ??C), Min:97.8 ??F (36.6 ??C), Max:98.4 ??F (36.9 ??C)     Intake/Output:   Last shift:      No intake/output data recorded.  Last 3 shifts: 01/21 0701 - 01/22 1900  In: 2001.1 [I.V.:2001.1]  Out: -     Intake/Output Summary (Last 24 hours) at 10/27/2017 2022  Last data filed at 10/27/2017 1732  Gross per 24 hour   Intake 2001.14 ml   Output ???   Net 2001.14 ml     Physical Exam:   General:  A&O x 4 in NAD    Head:  Normocephalic, without obvious abnormality, atraumatic.   Eyes:  Conjunctivae/corneas clear. PERRL,   Nose: Nares normal. Septum midline. Mucosa normal.   Throat: Lips, mucosa, and tongue normal. Teeth and gums normal.   Neck: Supple, symmetrical, trachea midline, no JVD.   Lungs:   Symmetrical chest rise; good AE bilat; CTAB, mildly diminished breath sounds throughout likely due to body habitus; no wheezes/rhonchi/rales noted.    Heart:  RRR, no murmur   Abdomen:   Soft and protuberant, non-tender. Bowel sounds normal. No masses,  No organomegaly.   Extremities: Extremities normal, atraumatic, no cyanosis or edema.   Pulses:  2+ and symmetric all extremities.   Skin: Skin color, texture, turgor normal. No rashes or lesions   Neurologic: Grossly nonfocal, strength and sensation intact.      Devices:  ?? Lines: Right IJ CVL- accessed, clean and dry dressing   ?? Foley: Purewick    Data:     Recent Results (from the past 24 hour(s))   EKG, 12 LEAD, INITIAL     Collection Time: 10/27/17  1:59 PM   Result Value Ref Range    Ventricular Rate 81 BPM    Atrial Rate 81 BPM    P-R Interval 172 ms    QRS Duration 86 ms    Q-T Interval 410 ms    QTC Calculation (Bezet) 476 ms    Calculated P Axis 36 degrees    Calculated R Axis 75 degrees    Calculated T Axis 69 degrees    Diagnosis       Sinus rhythm with premature atrial complexes  Otherwise normal ECG  When compared with ECG of 13-Mar-2010 14:28,  premature atrial complexes are now present     CBC WITH AUTOMATED DIFF    Collection Time: 10/27/17  2:10 PM   Result Value Ref Range    WBC 6.2 4.6 - 13.2 K/uL    RBC 3.65 (L) 4.20 - 5.30 M/uL    HGB 11.2 (L) 12.0 - 16.0 g/dL    HCT 33.9 (L) 35.0 - 45.0 %    MCV 92.9 74.0 - 97.0 FL    MCH 30.7 24.0 - 34.0 PG    MCHC 33.0 31.0 - 37.0 g/dL    RDW 14.5 11.6 - 14.5 %    PLATELET 187 135 - 420 K/uL    MPV 12.0 (H) 9.2 - 11.8 FL    NEUTROPHILS 78 (H) 40 - 73 %    LYMPHOCYTES 13 (L) 21 - 52 %    MONOCYTES 8 3 - 10 %    EOSINOPHILS 1 0 - 5 %    BASOPHILS 0 0 - 2 %    ABS. NEUTROPHILS 4.9 1.8 - 8.0 K/UL    ABS. LYMPHOCYTES 0.8 (L) 0.9 - 3.6 K/UL    ABS. MONOCYTES 0.5 0.05 - 1.2 K/UL    ABS. EOSINOPHILS 0.1 0.0 - 0.4 K/UL    ABS. BASOPHILS 0.0 0.0 - 0.1 K/UL    DF AUTOMATED     METABOLIC PANEL, BASIC    Collection Time: 10/27/17  2:10 PM   Result Value Ref Range    Sodium 143 136 - 145 mmol/L    Potassium 3.9 3.5 - 5.5 mmol/L    Chloride 113 (H) 100 - 108 mmol/L    CO2 22 21 - 32 mmol/L    Anion gap 8 3.0 - 18 mmol/L    Glucose 78 74 - 99 mg/dL    BUN 22 (H) 7.0 - 18 MG/DL    Creatinine 1.53 (H) 0.6 - 1.3 MG/DL    BUN/Creatinine ratio 14 12 - 20      GFR est AA 42 (L) >60 ml/min/1.55m2    GFR est non-AA 35 (L) >60 ml/min/1.5m2    Calcium 7.4 (L) 8.5 - 10.1 MG/DL   TROPONIN I    Collection Time: 10/27/17  2:10 PM   Result Value Ref Range    Troponin-I, QT <0.02 0.0 - 0.045 NG/ML   EKG, 12 LEAD, SUBSEQUENT    Collection Time: 10/27/17  3:14 PM   Result Value Ref Range  Ventricular Rate 71 BPM    Atrial Rate 71 BPM    P-R Interval 172 ms    QRS Duration 86 ms    Q-T Interval 440 ms    QTC Calculation (Bezet) 478 ms    Calculated P Axis 28 degrees    Calculated R Axis 69 degrees    Calculated T Axis 68 degrees    Diagnosis       Normal sinus rhythm with sinus arrhythmia  Normal ECG  When compared with ECG of 27-Oct-2017 13:59,  premature atrial complexes are no longer present     CBC WITH AUTOMATED DIFF    Collection Time: 10/27/17  4:25 PM   Result Value Ref Range    WBC 8.1 4.6 - 13.2 K/uL    RBC 3.84 (L) 4.20 - 5.30 M/uL    HGB 11.6 (L) 12.0 - 16.0 g/dL    HCT 35.8 35.0 - 45.0 %    MCV 93.2 74.0 - 97.0 FL    MCH 30.2 24.0 - 34.0 PG    MCHC 32.4 31.0 - 37.0 g/dL    RDW 14.5 11.6 - 14.5 %    PLATELET 203 135 - 420 K/uL    MPV 12.4 (H) 9.2 - 11.8 FL    NEUTROPHILS 75 (H) 40 - 73 %    LYMPHOCYTES 18 (L) 21 - 52 %    MONOCYTES 6 3 - 10 %    EOSINOPHILS 1 0 - 5 %    BASOPHILS 0 0 - 2 %    ABS. NEUTROPHILS 6.0 1.8 - 8.0 K/UL    ABS. LYMPHOCYTES 1.5 0.9 - 3.6 K/UL    ABS. MONOCYTES 0.5 0.05 - 1.2 K/UL    ABS. EOSINOPHILS 0.1 0.0 - 0.4 K/UL    ABS. BASOPHILS 0.0 0.0 - 0.1 K/UL    DF AUTOMATED     PTT    Collection Time: 10/27/17  4:25 PM   Result Value Ref Range    aPTT 82.3 (H) 23.0 - 36.4 SEC   TROPONIN I    Collection Time: 10/27/17  4:25 PM   Result Value Ref Range    Troponin-I, QT <0.02 0.0 - 0.045 NG/ML   EKG, 12 LEAD, INITIAL    Collection Time: 10/27/17  5:42 PM   Result Value Ref Range    Ventricular Rate 76 BPM    Atrial Rate 76 BPM    P-R Interval 188 ms    QRS Duration 92 ms    Q-T Interval 448 ms    QTC Calculation (Bezet) 504 ms    Calculated P Axis 67 degrees    Calculated R Axis 84 degrees    Calculated T Axis 83 degrees    Diagnosis       Sinus rhythm with premature atrial complexes  Prolonged QT  Abnormal ECG  When compared with ECG of 27-Oct-2017 15:14,  premature atrial complexes are now present     METABOLIC PANEL, BASIC    Collection Time: 10/27/17  6:24 PM    Result Value Ref Range    Sodium 141 136 - 145 mmol/L    Potassium 4.0 3.5 - 5.5 mmol/L    Chloride 110 (H) 100 - 108 mmol/L    CO2 21 21 - 32 mmol/L    Anion gap 10 3.0 - 18 mmol/L    Glucose 75 74 - 99 mg/dL    BUN 23 (H) 7.0 - 18 MG/DL    Creatinine 1.25 0.6 - 1.3 MG/DL  BUN/Creatinine ratio 18 12 - 20      GFR est AA 53 (L) >60 ml/min/1.70m2    GFR est non-AA 44 (L) >60 ml/min/1.27m2    Calcium 7.6 (L) 8.5 - 10.1 MG/DL   CBC WITH AUTOMATED DIFF    Collection Time: 10/27/17  6:24 PM   Result Value Ref Range    WBC 6.4 4.6 - 13.2 K/uL    RBC 3.76 (L) 4.20 - 5.30 M/uL    HGB 11.5 (L) 12.0 - 16.0 g/dL    HCT 34.8 (L) 35.0 - 45.0 %    MCV 92.6 74.0 - 97.0 FL    MCH 30.6 24.0 - 34.0 PG    MCHC 33.0 31.0 - 37.0 g/dL    RDW 14.5 11.6 - 14.5 %    PLATELET 190 135 - 420 K/uL    MPV 12.3 (H) 9.2 - 11.8 FL    NEUTROPHILS 73 40 - 73 %    LYMPHOCYTES 18 (L) 21 - 52 %    MONOCYTES 8 3 - 10 %    EOSINOPHILS 1 0 - 5 %    BASOPHILS 0 0 - 2 %    ABS. NEUTROPHILS 4.7 1.8 - 8.0 K/UL    ABS. LYMPHOCYTES 1.1 0.9 - 3.6 K/UL    ABS. MONOCYTES 0.5 0.05 - 1.2 K/UL    ABS. EOSINOPHILS 0.1 0.0 - 0.4 K/UL    ABS. BASOPHILS 0.0 0.0 - 0.1 K/UL    DF AUTOMATED     POC LACTIC ACID    Collection Time: 10/27/17  6:25 PM   Result Value Ref Range    Lactic Acid (POC) 0.38 (L) 0.40 - 2.00 mmol/L           No results for input(s): FIO2I, IFO2, HCO3I, IHCO3, HCOPOC, PCO2I, PCOPOC, IPHI, PHI, PHPOC, PO2I, PO2POC in the last 72 hours.    No lab exists for component: IPOC2    Telemetry:normal sinus rhythm    Imaging:  I have personally reviewed the patient???s radiographs and have reviewed the reports:    CXR 10/27/17: There is been interval placement of a right IJ central venous line  which extends to the peripheral SVC. No pneumothorax is seen. No focal airspace  opacity or effusion. Cardiomediastinal silhouette within normal limits..    CT HEAD/CHEST/ABD/PELVIS 10/27/17:  Preliminary read showed no PE or aortic dissection per Radiology, final  results pending          Total of 40  min critical care time spent at bedside during the course of care providing evaluation,management and care decisions and ordering appropriate treatment related to critical care problems exclusive of procedures.  The reason for providing this level of medical care for this critically ill patient was due a critical illness that impaired one or more vital organ systems such that there was a high probability of imminent or life threatening deterioration in the patients condition. This care involved high complexity decision making to assess, manipulate, and support vital system functions, to treat this degree vital organ system failure and to prevent further life threatening deterioration of the patient???s condition.    Signed By: Ruffin Frederick, PA-C     October 27, 2017 10:20 PM

## 2017-10-27 NOTE — ED Notes (Deleted)
placeholder

## 2017-10-27 NOTE — Consults (Signed)
[facility] - Consult Note    Date of  Admission: 10/27/2017  1:38 PM     Primary Care Physician:  None     Plan:     1) possible ACS- initial EKG- no ST elevation or St depression inspite of chest pain. Pain partially reproducible with palpation. Start heparin drip due to CAD history. Continue aspirin/ BB/ brilanta and statin.  2) CAD with recent PCI to LAD and PTCA to Diagonal.  3) htn  4) dyslipidemia    Will f/u.  Iv fluid bolus and treat for possible GERD symptoms.       Assessment:     Hospital Problems  Never Reviewed          Codes Class Noted POA    Hypotension ICD-10-CM: I95.9  ICD-9-CM: 458.9  10/27/2017 Unknown        Hypotension due to hypovolemia ICD-10-CM: I95.89, E86.1  ICD-9-CM: 458.8, 276.52  10/27/2017 Unknown                   History of Present Illness:         This is a 62 y.o. female admitted for Hypotension  Hypotension due to hypovolemia.    Patient complains of:      Patient is a 62 yr old female with CAD s/p PCI to LAD in November 2018- presents with chest tightness- start around 11 am- continuous with no significant radiation. No associated diaphoresis. no syncope.    EKG showed normal sinus rhythm with no acute st-t changes.  Chest pain reproducible with palpation.        Cardiac risk factors: htn, dyslipidemia         Past Medical History:   No past medical history on file.      Social History:     Social History     Socioeconomic History   ??? Marital status: MARRIED     Spouse name: Not on file   ??? Number of children: Not on file   ??? Years of education: Not on file   ??? Highest education level: Not on file        Family History:   No family history on file.     Medications:     Allergies   Allergen Reactions   ??? Morphine Hives and Itching   ??? Penicillins Rash   ??? Compazine [Prochlorperazine] Other (comments)     Jerking movements        Current Facility-Administered Medications   Medication Dose Route Frequency   ??? potassium chloride 10 mEq in 100 ml IVPB  10 mEq IntraVENous Q1H    ??? heparin 25,000 units in D5W 250 ml infusion  8-25 Units/kg/hr IntraVENous TITRATE   ??? NOREPINephrine (LEVOPHED) 8 mg in 5% dextrose 263m infusion  2-16 mcg/min IntraVENous TITRATE   ??? sodium chloride (NS) flush 5-40 mL  5-40 mL IntraVENous Q8H   ??? sodium chloride (NS) flush 5-40 mL  5-40 mL IntraVENous PRN   ??? hydroxychloroquine (PLAQUENIL) tablet 200 mg  200 mg Oral DAILY WITH BREAKFAST   ??? aspirin chewable tablet 81 mg  81 mg Oral DAILY   ??? famotidine (PF) (PEPCID) 20 mg in sodium chloride 0.9% 10 mL injection  20 mg IntraVENous Q12H   ??? [START ON 10/29/2017] thyroid (Pork) (ARMOUR) tablet 90 mg  90 mg Oral DAILY   ??? ticagrelor (BRILINTA) tablet 90 mg  90 mg Oral Q12H   ??? simvastatin (ZOCOR) tablet 20 mg  20 mg  Oral QHS   ??? pramipexole (MIRAPEX) tablet 1 mg  1 mg Oral QHS   ??? glucose chewable tablet 16 g  4 Tab Oral PRN   ??? glucagon (GLUCAGEN) injection 1 mg  1 mg IntraMUSCular PRN   ??? dextrose (D50W) injection syrg 12.5-25 g  25-50 mL IntraVENous PRN        Review Of Systems:       A comprehensive review of systems was negative except for that written in the HPI.    Constitutional: No fever, no chills, no weight loss, no night sweats   HEENT: No epistaxis, no nasal drainage, no difficulty in swallowing, no redness in eyes  Respiratory: negative for cough, sputum, hemoptysis, pleurisy/chest pain, wheezing, dyspnea on exertion or emphysema  Cardiovascular:  no dyspnea, no pnd, no claudication no palpitations, no chronic leg edema, no syncope  Gastrointestinal: no abd pain, no vomiting, no diarrhea, no bleeding symptoms  Genitourinary: No urinary symptoms or hematuria  Integument/breast: No ulcers or rashes  Musculoskeletal: no muscle pain, no weakness  Neurological: No focal weakness, no seizures, no headaches  Behvioral/Psych: No anxiety, no depression             Physical Exam:     Visit Vitals  BP 139/89   Pulse 85   Temp 97.9 ??F (36.6 ??C)   Resp 16   Ht '5\' 5"'$  (1.651 m)   Wt 112.9 kg (249 lb)   SpO2 100%    BMI 41.44 kg/m??     BP Readings from Last 3 Encounters:   10/28/17 139/89     Pulse Readings from Last 3 Encounters:   10/28/17 85     Wt Readings from Last 3 Encounters:   10/28/17 112.9 kg (249 lb)       General:  alert, cooperative, no distress, appears stated age, tenderness to palpation to the chest  Skin: Warm and dry, acyanotic, normal color.  Head: Normocephalic, atraumatic.    Eyes: Sclerae anicteric, conjunctivae without injection.  Neck:  nontender, no nuchal rigidity, no masses, no stridor, no carotid bruit, no JVD  Lungs:  clear to auscultation bilaterally,no  rhonchi ,   Heart:  regular rate and rhythm, S1, S2 normal, no murmur, click, rub or gallop  Abdomen:  abdomen is soft without significant tenderness, masses, organomegaly or guarding  Extremities:  extremities normal, atraumatic, no cyanosis or edema. Peripheral pulses   Neurological: grossly intact. No focal abnormalities, moves all extremities well.  Psychiatric Affect: The patient is awake, alert and oriented x3. Forde Dandy is interactive and appropriate.   Data Review:     Recent Results (from the past 48 hour(s))   EKG, 12 LEAD, INITIAL    Collection Time: 10/27/17  1:59 PM   Result Value Ref Range    Ventricular Rate 81 BPM    Atrial Rate 81 BPM    P-R Interval 172 ms    QRS Duration 86 ms    Q-T Interval 410 ms    QTC Calculation (Bezet) 476 ms    Calculated P Axis 36 degrees    Calculated R Axis 75 degrees    Calculated T Axis 69 degrees    Diagnosis       Sinus rhythm with premature atrial complexes  Otherwise normal ECG  When compared with ECG of 13-Mar-2010 14:28,  premature atrial complexes are now present  Confirmed by Fredirick Maudlin [phone]) on 10/28/2017 9:35:35 AM     CBC WITH AUTOMATED DIFF    Collection Time:  10/27/17  2:10 PM   Result Value Ref Range    WBC 6.2 4.6 - 13.2 K/uL    RBC 3.65 (L) 4.20 - 5.30 M/uL    HGB 11.2 (L) 12.0 - 16.0 g/dL    HCT 33.9 (L) 35.0 - 45.0 %    MCV 92.9 74.0 - 97.0 FL     MCH 30.7 24.0 - 34.0 PG    MCHC 33.0 31.0 - 37.0 g/dL    RDW 14.5 11.6 - 14.5 %    PLATELET 187 135 - 420 K/uL    MPV 12.0 (H) 9.2 - 11.8 FL    NEUTROPHILS 78 (H) 40 - 73 %    LYMPHOCYTES 13 (L) 21 - 52 %    MONOCYTES 8 3 - 10 %    EOSINOPHILS 1 0 - 5 %    BASOPHILS 0 0 - 2 %    ABS. NEUTROPHILS 4.9 1.8 - 8.0 K/UL    ABS. LYMPHOCYTES 0.8 (L) 0.9 - 3.6 K/UL    ABS. MONOCYTES 0.5 0.05 - 1.2 K/UL    ABS. EOSINOPHILS 0.1 0.0 - 0.4 K/UL    ABS. BASOPHILS 0.0 0.0 - 0.1 K/UL    DF AUTOMATED     METABOLIC PANEL, BASIC    Collection Time: 10/27/17  2:10 PM   Result Value Ref Range    Sodium 143 136 - 145 mmol/L    Potassium 3.9 3.5 - 5.5 mmol/L    Chloride 113 (H) 100 - 108 mmol/L    CO2 22 21 - 32 mmol/L    Anion gap 8 3.0 - 18 mmol/L    Glucose 78 74 - 99 mg/dL    BUN 22 (H) 7.0 - 18 MG/DL    Creatinine 1.53 (H) 0.6 - 1.3 MG/DL    BUN/Creatinine ratio 14 12 - 20      GFR est AA 42 (L) >60 ml/min/1.77m    GFR est non-AA 35 (L) >60 ml/min/1.716m   Calcium 7.4 (L) 8.5 - 10.1 MG/DL   TROPONIN I    Collection Time: 10/27/17  2:10 PM   Result Value Ref Range    Troponin-I, QT <0.02 0.0 - 0.045 NG/ML   EKG, 12 LEAD, SUBSEQUENT    Collection Time: 10/27/17  3:14 PM   Result Value Ref Range    Ventricular Rate 71 BPM    Atrial Rate 71 BPM    P-R Interval 172 ms    QRS Duration 86 ms    Q-T Interval 440 ms    QTC Calculation (Bezet) 478 ms    Calculated P Axis 28 degrees    Calculated R Axis 69 degrees    Calculated T Axis 68 degrees    Diagnosis       Normal sinus rhythm with sinus arrhythmia  Normal ECG  When compared with ECG of 27-Oct-2017 13:59,  premature atrial complexes are no longer present  Confirmed by VeFredirick [phone] 10/28/2017 9:35:04 AM     CBC WITH AUTOMATED DIFF    Collection Time: 10/27/17  4:25 PM   Result Value Ref Range    WBC 8.1 4.6 - 13.2 K/uL    RBC 3.84 (L) 4.20 - 5.30 M/uL    HGB 11.6 (L) 12.0 - 16.0 g/dL    HCT 35.8 35.0 - 45.0 %    MCV 93.2 74.0 - 97.0 FL    MCH 30.2 24.0 - 34.0 PG     MCHC 32.4 31.0 - 37.0 g/dL  RDW 14.5 11.6 - 14.5 %    PLATELET 203 135 - 420 K/uL    MPV 12.4 (H) 9.2 - 11.8 FL    NEUTROPHILS 75 (H) 40 - 73 %    LYMPHOCYTES 18 (L) 21 - 52 %    MONOCYTES 6 3 - 10 %    EOSINOPHILS 1 0 - 5 %    BASOPHILS 0 0 - 2 %    ABS. NEUTROPHILS 6.0 1.8 - 8.0 K/UL    ABS. LYMPHOCYTES 1.5 0.9 - 3.6 K/UL    ABS. MONOCYTES 0.5 0.05 - 1.2 K/UL    ABS. EOSINOPHILS 0.1 0.0 - 0.4 K/UL    ABS. BASOPHILS 0.0 0.0 - 0.1 K/UL    DF AUTOMATED     PTT    Collection Time: 10/27/17  4:25 PM   Result Value Ref Range    aPTT 82.3 (H) 23.0 - 36.4 SEC   TROPONIN I    Collection Time: 10/27/17  4:25 PM   Result Value Ref Range    Troponin-I, QT <0.02 0.0 - 0.045 NG/ML   TYPE & SCREEN    Collection Time: 10/27/17  4:25 PM   Result Value Ref Range    Crossmatch Expiration 10/30/2017     ABO/Rh(D) B POSITIVE     Antibody screen NEG    EKG, 12 LEAD, INITIAL    Collection Time: 10/27/17  5:42 PM   Result Value Ref Range    Ventricular Rate 76 BPM    Atrial Rate 76 BPM    P-R Interval 188 ms    QRS Duration 92 ms    Q-T Interval 448 ms    QTC Calculation (Bezet) 504 ms    Calculated P Axis 67 degrees    Calculated R Axis 84 degrees    Calculated T Axis 83 degrees    Diagnosis       Sinus rhythm with premature atrial complexes  Prolonged QT  Abnormal ECG  When compared with ECG of 27-Oct-2017 15:14,  premature atrial complexes are now present  Confirmed by Fredirick Maudlin [phone]) on 10/28/2017 3:71:06 AM     METABOLIC PANEL, BASIC    Collection Time: 10/27/17  6:24 PM   Result Value Ref Range    Sodium 141 136 - 145 mmol/L    Potassium 4.0 3.5 - 5.5 mmol/L    Chloride 110 (H) 100 - 108 mmol/L    CO2 21 21 - 32 mmol/L    Anion gap 10 3.0 - 18 mmol/L    Glucose 75 74 - 99 mg/dL    BUN 23 (H) 7.0 - 18 MG/DL    Creatinine 1.25 0.6 - 1.3 MG/DL    BUN/Creatinine ratio 18 12 - 20      GFR est AA 53 (L) >60 ml/min/1.61m2    GFR est non-AA 44 (L) >60 ml/min/1.58m2    Calcium 7.6 (L) 8.5 - 10.1 MG/DL    CBC WITH AUTOMATED DIFF    Collection Time: 10/27/17  6:24 PM   Result Value Ref Range    WBC 6.4 4.6 - 13.2 K/uL    RBC 3.76 (L) 4.20 - 5.30 M/uL    HGB 11.5 (L) 12.0 - 16.0 g/dL    HCT 34.8 (L) 35.0 - 45.0 %    MCV 92.6 74.0 - 97.0 FL    MCH 30.6 24.0 - 34.0 PG    MCHC 33.0 31.0 - 37.0 g/dL    RDW 14.5 11.6 - 14.5 %  PLATELET 190 135 - 420 K/uL    MPV 12.3 (H) 9.2 - 11.8 FL    NEUTROPHILS 73 40 - 73 %    LYMPHOCYTES 18 (L) 21 - 52 %    MONOCYTES 8 3 - 10 %    EOSINOPHILS 1 0 - 5 %    BASOPHILS 0 0 - 2 %    ABS. NEUTROPHILS 4.7 1.8 - 8.0 K/UL    ABS. LYMPHOCYTES 1.1 0.9 - 3.6 K/UL    ABS. MONOCYTES 0.5 0.05 - 1.2 K/UL    ABS. EOSINOPHILS 0.1 0.0 - 0.4 K/UL    ABS. BASOPHILS 0.0 0.0 - 0.1 K/UL    DF AUTOMATED     CULTURE, BLOOD    Collection Time: 10/27/17  6:24 PM   Result Value Ref Range    Special Requests: NO SPECIAL REQUESTS      Culture result: NO GROWTH AFTER 12 HOURS     POC LACTIC ACID    Collection Time: 10/27/17  6:25 PM   Result Value Ref Range    Lactic Acid (POC) 0.38 (L) 0.40 - 2.00 mmol/L   PTT    Collection Time: 10/27/17  6:33 PM   Result Value Ref Range    aPTT 32.6 23.0 - 36.4 SEC   CULTURE, BLOOD    Collection Time: 10/27/17 10:00 PM   Result Value Ref Range    Special Requests: NO SPECIAL REQUESTS      Culture result: NO GROWTH AFTER 7 HOURS     HEPATIC FUNCTION PANEL    Collection Time: 10/27/17 10:30 PM   Result Value Ref Range    Protein, total 6.1 (L) 6.4 - 8.2 g/dL    Albumin 3.4 3.4 - 5.0 g/dL    Globulin 2.7 2.0 - 4.0 g/dL    A-G Ratio 1.3 0.8 - 1.7      Bilirubin, total 0.4 0.2 - 1.0 MG/DL    Bilirubin, direct 0.1 0.0 - 0.2 MG/DL    Alk. phosphatase 73 45 - 117 U/L    AST (SGOT) 12 (L) 15 - 37 U/L    ALT (SGPT) 19 13 - 56 U/L   MAGNESIUM    Collection Time: 10/27/17 10:30 PM   Result Value Ref Range    Magnesium 2.0 1.6 - 2.6 mg/dL   CALCIUM, IONIZED    Collection Time: 10/27/17 10:30 PM   Result Value Ref Range    Ionized Calcium 1.16 1.12 - 1.32 MMOL/L   TSH 3RD GENERATION     Collection Time: 10/27/17 10:30 PM   Result Value Ref Range    TSH 18.30 (H) 0.36 - 3.74 uIU/mL   T4, FREE    Collection Time: 10/27/17 10:30 PM   Result Value Ref Range    T4, Free 0.6 (L) 0.7 - 1.5 NG/DL   NT-PRO BNP    Collection Time: 10/27/17 10:30 PM   Result Value Ref Range    NT pro-BNP 135 0 - 900 PG/ML   PTT    Collection Time: 10/27/17 10:30 PM   Result Value Ref Range    aPTT 35.3 23.0 - 36.4 SEC   PTT    Collection Time: 10/28/17  4:00 AM   Result Value Ref Range    aPTT 86.5 (H) 23.0 - 36.4 SEC   CBC WITH AUTOMATED DIFF    Collection Time: 10/28/17  4:00 AM   Result Value Ref Range    WBC 5.8 4.6 - 13.2 K/uL    RBC 3.62 (L)  4.20 - 5.30 M/uL    HGB 11.0 (L) 12.0 - 16.0 g/dL    HCT 33.6 (L) 35.0 - 45.0 %    MCV 92.8 74.0 - 97.0 FL    MCH 30.4 24.0 - 34.0 PG    MCHC 32.7 31.0 - 37.0 g/dL    RDW 14.6 (H) 11.6 - 14.5 %    PLATELET 189 135 - 420 K/uL    MPV 12.5 (H) 9.2 - 11.8 FL    NEUTROPHILS 66 40 - 73 %    LYMPHOCYTES 23 21 - 52 %    MONOCYTES 8 3 - 10 %    EOSINOPHILS 2 0 - 5 %    BASOPHILS 1 0 - 2 %    ABS. NEUTROPHILS 3.8 1.8 - 8.0 K/UL    ABS. LYMPHOCYTES 1.3 0.9 - 3.6 K/UL    ABS. MONOCYTES 0.5 0.05 - 1.2 K/UL    ABS. EOSINOPHILS 0.1 0.0 - 0.4 K/UL    ABS. BASOPHILS 0.0 0.0 - 0.1 K/UL    DF AUTOMATED     METABOLIC PANEL, BASIC    Collection Time: 10/28/17  4:00 AM   Result Value Ref Range    Sodium 142 136 - 145 mmol/L    Potassium 3.4 (L) 3.5 - 5.5 mmol/L    Chloride 112 (H) 100 - 108 mmol/L    CO2 22 21 - 32 mmol/L    Anion gap 8 3.0 - 18 mmol/L    Glucose 86 74 - 99 mg/dL    BUN 18 7.0 - 18 MG/DL    Creatinine 0.88 0.6 - 1.3 MG/DL    BUN/Creatinine ratio 20 12 - 20      GFR est AA >60 >60 ml/min/1.61m    GFR est non-AA >60 >60 ml/min/1.718m   Calcium 7.9 (L) 8.5 - 10.1 MG/DL   CALCIUM, IONIZED    Collection Time: 10/28/17  4:00 AM   Result Value Ref Range    Ionized Calcium 1.19 1.12 - 1.32 MMOL/L   MAGNESIUM    Collection Time: 10/28/17  4:00 AM   Result Value Ref Range     Magnesium 2.2 1.6 - 2.6 mg/dL   PHOSPHORUS    Collection Time: 10/28/17  4:00 AM   Result Value Ref Range    Phosphorus 2.9 2.5 - 4.9 MG/DL   EKG, 12 LEAD, SUBSEQUENT    Collection Time: 10/28/17  7:11 AM   Result Value Ref Range    Ventricular Rate 80 BPM    Atrial Rate 80 BPM    P-R Interval 192 ms    QRS Duration 90 ms    Q-T Interval 386 ms    QTC Calculation (Bezet) 445 ms    Calculated P Axis 49 degrees    Calculated R Axis 84 degrees    Calculated T Axis 79 degrees    Diagnosis       Sinus rhythm with premature atrial complexes  Otherwise normal ECG  When compared with ECG of 27-Oct-2017 17:42,  QT has shortened  Confirmed by VeFredirick [phone] 10/28/2017 9:30:59 AM     GLUCOSE, POC    Collection Time: 10/28/17 12:26 PM   Result Value Ref Range    Glucose (POC) 70 70 - 110 mg/dL         Intake/Output Summary (Last 24 hours) at 10/28/2017 1325  Last data filed at 10/28/2017 1153  Gross per 24 hour   Intake 2775.88 ml   Output 1400 ml  Net 1375.88 ml       Cardiographics:     ECG: normal EKG, normal sinus rhythm    Echocardiogram: 08/2017:  NORMAL GLOBAL LEFT VENTRICULAR SYSTOLIC FUNCTION.   EJECTION FRACTION 65% BY SIMPSON'S.   STAGE I (MILD) DIASTOLIC DYSFUNCTION.   NO HEMODYNAMICALLY SIGNIFICANT VALVULAR PATHOLOGY.     PREVIOUS ECHO DATED 04/03/14 SHOWED:   1)EJECTION FRACTION 60%.   2)RVSP 23 MMHG      Signed By: Fredirick Maudlin MD    October 28, 2017

## 2017-10-28 ENCOUNTER — Inpatient Hospital Stay: Admit: 2017-10-28 | Payer: PRIVATE HEALTH INSURANCE | Primary: Internal Medicine

## 2017-10-28 LAB — HEPATIC FUNCTION PANEL
A-G Ratio: 1.3 (ref 0.8–1.7)
ALT (SGPT): 19 U/L (ref 13–56)
AST (SGOT): 12 U/L — ABNORMAL LOW (ref 15–37)
Albumin: 3.4 g/dL (ref 3.4–5.0)
Alk. phosphatase: 73 U/L (ref 45–117)
Bilirubin, direct: 0.1 MG/DL (ref 0.0–0.2)
Bilirubin, total: 0.4 MG/DL (ref 0.2–1.0)
Globulin: 2.7 g/dL (ref 2.0–4.0)
Protein, total: 6.1 g/dL — ABNORMAL LOW (ref 6.4–8.2)

## 2017-10-28 LAB — ECHO ADULT FOLLOW-UP OR LIMITED
EF BP: 64.9 % (ref 55–100)
IVSd: 1.21 cm — AB (ref 0.6–0.9)
LV EDV A2C: 73.7 mL
LV EDV A4C: 68.2 mL
LV EDV BP: 71.6 ml (ref 56–104)
LV EDV Index A2C: 33.9 mL/m2
LV EDV Index A4C: 31.4 mL/m2
LV EDV Index BP: 33 mL/m2
LV ESV A2C: 25.8 mL
LV ESV A4C: 23.7 mL
LV ESV BP: 25.1 mL (ref 19–49)
LV ESV Index A2C: 11.9 mL/m2
LV ESV Index A4C: 10.9 mL/m2
LV ESV Index BP: 11.6 mL/m2
LV Ejection Fraction A2C: 65 %
LV Ejection Fraction A4C: 65 %
LV Mass 2D Index: 87.2 g/m2 (ref 43–95)
LV Mass 2D: 189.3 g — AB (ref 67–162)
LVIDd: 3.92 cm (ref 3.9–5.3)
LVIDs: 2.25 cm
LVPWd: 1.21 cm — AB (ref 0.6–0.9)
PASP: 27 mmHg
TR Max Velocity: 246.55 cm/s
TR Peak Gradient: 24.3 mmHg

## 2017-10-28 LAB — CALCIUM, IONIZED
Ionized Calcium: 1.16 MMOL/L (ref 1.12–1.32)
Ionized Calcium: 1.19 MMOL/L (ref 1.12–1.32)

## 2017-10-28 LAB — TYPE & SCREEN
ABO/Rh(D): B POS
Antibody screen: NEGATIVE

## 2017-10-28 LAB — CBC WITH AUTOMATED DIFF
ABS. BASOPHILS: 0 10*3/uL (ref 0.0–0.1)
ABS. EOSINOPHILS: 0.1 10*3/uL (ref 0.0–0.4)
ABS. LYMPHOCYTES: 1.3 10*3/uL (ref 0.9–3.6)
ABS. MONOCYTES: 0.5 10*3/uL (ref 0.05–1.2)
ABS. NEUTROPHILS: 3.8 10*3/uL (ref 1.8–8.0)
BASOPHILS: 1 % (ref 0–2)
EOSINOPHILS: 2 % (ref 0–5)
HCT: 33.6 % — ABNORMAL LOW (ref 35.0–45.0)
HGB: 11 g/dL — ABNORMAL LOW (ref 12.0–16.0)
LYMPHOCYTES: 23 % (ref 21–52)
MCH: 30.4 PG (ref 24.0–34.0)
MCHC: 32.7 g/dL (ref 31.0–37.0)
MCV: 92.8 FL (ref 74.0–97.0)
MONOCYTES: 8 % (ref 3–10)
MPV: 12.5 FL — ABNORMAL HIGH (ref 9.2–11.8)
NEUTROPHILS: 66 % (ref 40–73)
PLATELET: 189 10*3/uL (ref 135–420)
RBC: 3.62 M/uL — ABNORMAL LOW (ref 4.20–5.30)
RDW: 14.6 % — ABNORMAL HIGH (ref 11.6–14.5)
WBC: 5.8 10*3/uL (ref 4.6–13.2)

## 2017-10-28 LAB — EKG, 12 LEAD, INITIAL
Atrial Rate: 76 {beats}/min
Atrial Rate: 81 {beats}/min
Calculated P Axis: 36 degrees
Calculated P Axis: 67 degrees
Calculated R Axis: 75 degrees
Calculated R Axis: 84 degrees
Calculated T Axis: 69 degrees
Calculated T Axis: 83 degrees
P-R Interval: 172 ms
P-R Interval: 188 ms
Q-T Interval: 410 ms
Q-T Interval: 448 ms
QRS Duration: 86 ms
QRS Duration: 92 ms
QTC Calculation (Bezet): 476 ms
QTC Calculation (Bezet): 504 ms
Ventricular Rate: 76 {beats}/min
Ventricular Rate: 81 {beats}/min

## 2017-10-28 LAB — METABOLIC PANEL, BASIC
Anion gap: 10 mmol/L (ref 3.0–18)
Anion gap: 8 mmol/L (ref 3.0–18)
BUN/Creatinine ratio: 18 (ref 12–20)
BUN/Creatinine ratio: 20 (ref 12–20)
BUN: 18 MG/DL (ref 7.0–18)
BUN: 23 MG/DL — ABNORMAL HIGH (ref 7.0–18)
CO2: 21 mmol/L (ref 21–32)
CO2: 22 mmol/L (ref 21–32)
Calcium: 7.6 MG/DL — ABNORMAL LOW (ref 8.5–10.1)
Calcium: 7.9 MG/DL — ABNORMAL LOW (ref 8.5–10.1)
Chloride: 110 mmol/L — ABNORMAL HIGH (ref 100–108)
Chloride: 112 mmol/L — ABNORMAL HIGH (ref 100–108)
Creatinine: 0.88 MG/DL (ref 0.6–1.3)
Creatinine: 1.25 MG/DL (ref 0.6–1.3)
GFR est AA: 53 mL/min/{1.73_m2} — ABNORMAL LOW (ref >60)
GFR est AA: >60 mL/min/{1.73_m2} (ref >60)
GFR est non-AA: 44 mL/min/{1.73_m2} — ABNORMAL LOW (ref >60)
GFR est non-AA: >60 mL/min/{1.73_m2} (ref >60)
Glucose: 75 mg/dL (ref 74–99)
Glucose: 86 mg/dL (ref 74–99)
Potassium: 3.4 mmol/L — ABNORMAL LOW (ref 3.5–5.5)
Potassium: 4 mmol/L (ref 3.5–5.5)
Sodium: 141 mmol/L (ref 136–145)
Sodium: 142 mmol/L (ref 136–145)

## 2017-10-28 LAB — EKG, 12 LEAD, SUBSEQUENT
Atrial Rate: 71 {beats}/min
Atrial Rate: 80 {beats}/min
Calculated P Axis: 28 degrees
Calculated P Axis: 49 degrees
Calculated R Axis: 69 degrees
Calculated R Axis: 84 degrees
Calculated T Axis: 68 degrees
Calculated T Axis: 79 degrees
Diagnosis: NORMAL
P-R Interval: 172 ms
P-R Interval: 192 ms
Q-T Interval: 386 ms
Q-T Interval: 440 ms
QRS Duration: 86 ms
QRS Duration: 90 ms
QTC Calculation (Bezet): 445 ms
QTC Calculation (Bezet): 478 ms
Ventricular Rate: 71 {beats}/min
Ventricular Rate: 80 {beats}/min

## 2017-10-28 LAB — GLUCOSE, POC
Glucose (POC): 70 mg/dL (ref 70–110)
Glucose (POC): 89 mg/dL (ref 70–110)

## 2017-10-28 LAB — TSH 3RD GENERATION: TSH: 18.3 u[IU]/mL — ABNORMAL HIGH (ref 0.36–3.74)

## 2017-10-28 LAB — PHOSPHORUS: Phosphorus: 2.9 MG/DL (ref 2.5–4.9)

## 2017-10-28 LAB — MAGNESIUM
Magnesium: 2 mg/dL (ref 1.6–2.6)
Magnesium: 2.2 mg/dL (ref 1.6–2.6)

## 2017-10-28 LAB — PTT
aPTT: 32.6 s (ref 23.0–36.4)
aPTT: 35.3 s (ref 23.0–36.4)
aPTT: 86.5 s — ABNORMAL HIGH (ref 23.0–36.4)

## 2017-10-28 LAB — T4, FREE: T4, Free: 0.6 NG/DL — ABNORMAL LOW (ref 0.7–1.5)

## 2017-10-28 LAB — NT-PRO BNP: NT pro-BNP: 135 PG/ML (ref 0–900)

## 2017-10-28 LAB — EKG 12-LEAD
Atrial Rate: 71 {beats}/min
Atrial Rate: 76 {beats}/min
Atrial Rate: 80 {beats}/min
Atrial Rate: 81 {beats}/min
Diagnosis: NORMAL
P Axis: 28 degrees
P Axis: 36 degrees
P Axis: 49 degrees
P Axis: 67 degrees
P-R Interval: 172 ms
P-R Interval: 172 ms
P-R Interval: 188 ms
P-R Interval: 192 ms
Q-T Interval: 386 ms
Q-T Interval: 410 ms
Q-T Interval: 440 ms
Q-T Interval: 448 ms
QRS Duration: 86 ms
QRS Duration: 86 ms
QRS Duration: 90 ms
QRS Duration: 92 ms
QTc Calculation (Bazett): 445 ms
QTc Calculation (Bazett): 476 ms
QTc Calculation (Bazett): 478 ms
QTc Calculation (Bazett): 504 ms
R Axis: 69 degrees
R Axis: 75 degrees
R Axis: 84 degrees
R Axis: 84 degrees
T Axis: 68 degrees
T Axis: 69 degrees
T Axis: 79 degrees
T Axis: 83 degrees
Ventricular Rate: 71 {beats}/min
Ventricular Rate: 76 {beats}/min
Ventricular Rate: 80 {beats}/min
Ventricular Rate: 81 {beats}/min

## 2017-10-28 LAB — TRANSTHORACIC ECHOCARDIOGRAM (TTE) LIMITED (CONTRAST/BUBBLE/3D PRN)
EF BP: 64.9 % (ref 55–100)
IVSd: 1.21 cm — AB (ref 0.6–0.9)
LV EDV A2C: 73.7 mL
LV EDV A4C: 68.2 mL
LV EDV BP: 71.6 ml (ref 56–104)
LV EDV Index A2C: 33.9 mL/m2
LV EDV Index A4C: 31.4 mL/m2
LV EDV Index BP: 33 mL/m2
LV ESV A2C: 25.8 mL
LV ESV A4C: 23.7 mL
LV ESV BP: 25.1 mL (ref 19–49)
LV ESV Index A2C: 11.9 mL/m2
LV ESV Index A4C: 10.9 mL/m2
LV ESV Index BP: 11.6 mL/m2
LV Ejection Fraction A2C: 65 %
LV Ejection Fraction A4C: 65 %
LV Mass 2D Index: 87.2 g/m2 (ref 43–95)
LV Mass 2D: 189.3 g — AB (ref 67–162)
LVIDd: 3.92 cm (ref 3.9–5.3)
LVIDs: 2.25 cm
LVPWd: 1.21 cm — AB (ref 0.6–0.9)
Left Ventricular Ejection Fraction: 63
PASP: 27 mmHg
TR Max Velocity: 246.55 cm/s
TR Peak Gradient: 24.3 mmHg

## 2017-10-28 LAB — TYPE AND SCREEN
ABO/Rh: B POS
Antibody Screen: NEGATIVE

## 2017-10-28 MED ORDER — ALBUMIN, HUMAN 25 % IV
25 % | Freq: Once | INTRAVENOUS | Status: AC
Start: 2017-10-28 — End: 2017-10-27
  Administered 2017-10-28: 03:00:00 via INTRAVENOUS

## 2017-10-28 MED ORDER — SODIUM CHLORIDE 0.9 % IJ SYRG
Freq: Three times a day (TID) | INTRAMUSCULAR | Status: DC
Start: 2017-10-28 — End: 2017-10-29
  Administered 2017-10-28 – 2017-10-29 (×6): via INTRAVENOUS

## 2017-10-28 MED ORDER — TICAGRELOR 90 MG TAB
90 mg | Freq: Two times a day (BID) | ORAL | Status: DC
Start: 2017-10-28 — End: 2017-10-29
  Administered 2017-10-28 – 2017-10-29 (×4): via ORAL

## 2017-10-28 MED ORDER — ASPIRIN 81 MG CHEWABLE TAB
81 mg | Freq: Every day | ORAL | Status: DC
Start: 2017-10-28 — End: 2017-10-29
  Administered 2017-10-28 – 2017-10-29 (×2): via ORAL

## 2017-10-28 MED ORDER — GLUCOSE 4 GRAM CHEWABLE TAB
4 gram | ORAL | Status: DC | PRN
Start: 2017-10-28 — End: 2017-10-29

## 2017-10-28 MED ORDER — THYROID (PORK) 30 MG TAB
30 mg | Freq: Every day | ORAL | Status: DC
Start: 2017-10-28 — End: 2017-10-28

## 2017-10-28 MED ORDER — PRAMIPEXOLE 0.25 MG TAB
0.25 mg | Freq: Every evening | ORAL | Status: DC
Start: 2017-10-28 — End: 2017-10-29
  Administered 2017-10-28 – 2017-10-29 (×2): via ORAL

## 2017-10-28 MED ORDER — HEPARIN (PORCINE) 1,000 UNIT/ML IJ SOLN
1000 unit/mL | Freq: Once | INTRAMUSCULAR | Status: AC
Start: 2017-10-28 — End: 2017-10-28
  Administered 2017-10-28: 07:00:00 via INTRAVENOUS

## 2017-10-28 MED ORDER — POTASSIUM CHLORIDE 10 MEQ/100 ML IV PIGGY BACK
10 mEq/0 mL | INTRAVENOUS | Status: AC
Start: 2017-10-28 — End: 2017-10-28
  Administered 2017-10-28 (×5): via INTRAVENOUS

## 2017-10-28 MED ORDER — SODIUM CHLORIDE 0.9 % IJ SYRG
INTRAMUSCULAR | Status: DC | PRN
Start: 2017-10-28 — End: 2017-10-29

## 2017-10-28 MED ORDER — DEXTROSE 50% IN WATER (D50W) IV SYRG
INTRAVENOUS | Status: DC | PRN
Start: 2017-10-28 — End: 2017-10-29

## 2017-10-28 MED ORDER — GLUCAGON 1 MG INJECTION
1 mg | INTRAMUSCULAR | Status: DC | PRN
Start: 2017-10-28 — End: 2017-10-29

## 2017-10-28 MED ORDER — HEPARIN (PORCINE) 5,000 UNIT/ML IJ SOLN
5000 unit/mL | INTRAMUSCULAR | Status: DC
Start: 2017-10-28 — End: 2017-10-28

## 2017-10-28 MED ORDER — SODIUM CHLORIDE 0.9 % IV
INTRAVENOUS | Status: DC
Start: 2017-10-28 — End: 2017-10-27
  Administered 2017-10-28: via INTRAVENOUS

## 2017-10-28 MED ORDER — HEPARIN (PORCINE) 1,000 UNIT/ML IJ SOLN
1000 unit/mL | INTRAMUSCULAR | Status: AC
Start: 2017-10-28 — End: 2017-10-28
  Administered 2017-10-28: 11:00:00 via INTRAVENOUS

## 2017-10-28 MED ORDER — FAMOTIDINE (PF) 20 MG/2 ML IV
20 mg/2 mL | Freq: Two times a day (BID) | INTRAVENOUS | Status: DC
Start: 2017-10-28 — End: 2017-10-29
  Administered 2017-10-28 – 2017-10-29 (×4): via INTRAVENOUS

## 2017-10-28 MED ORDER — SIMVASTATIN 20 MG TAB
20 mg | Freq: Every evening | ORAL | Status: DC
Start: 2017-10-28 — End: 2017-10-29
  Administered 2017-10-29: 02:00:00 via ORAL

## 2017-10-28 MED ORDER — THYROID (PORK) 30 MG TAB
30 mg | ORAL | Status: DC
Start: 2017-10-28 — End: 2017-10-29
  Administered 2017-10-29: 11:00:00 via ORAL

## 2017-10-28 MED ORDER — HYDROXYCHLOROQUINE 200 MG TAB
200 mg | Freq: Every day | ORAL | Status: DC
Start: 2017-10-28 — End: 2017-10-29
  Administered 2017-10-28 – 2017-10-29 (×2): via ORAL

## 2017-10-28 MED ORDER — ELECTROLYTE-A INTRAVENOUS SOLUTION
INTRAVENOUS | Status: DC
Start: 2017-10-28 — End: 2017-10-28
  Administered 2017-10-28: 17:00:00 via INTRAVENOUS

## 2017-10-28 MED FILL — BRILINTA 90 MG TABLET: 90 mg | ORAL | Qty: 1

## 2017-10-28 MED FILL — POTASSIUM CHLORIDE 10 MEQ/100 ML IV PIGGY BACK: 10 mEq/0 mL | INTRAVENOUS | Qty: 100

## 2017-10-28 MED FILL — HEPARIN (PORCINE) IN D5W 25,000 UNIT/250 ML IV: 25000 unit/250 mL(100 unit/mL) | INTRAVENOUS | Qty: 250

## 2017-10-28 MED FILL — ALBUMINAR 25 % INTRAVENOUS SOLUTION: 25 % | INTRAVENOUS | Qty: 100

## 2017-10-28 MED FILL — ASPIRIN 81 MG CHEWABLE TAB: 81 mg | ORAL | Qty: 1

## 2017-10-28 MED FILL — HEPARIN (PORCINE) 5,000 UNIT/ML IJ SOLN: 5000 unit/mL | INTRAMUSCULAR | Qty: 1

## 2017-10-28 MED FILL — PRAMIPEXOLE 0.25 MG TAB: 0.25 mg | ORAL | Qty: 4

## 2017-10-28 MED FILL — HYDROXYCHLOROQUINE 200 MG TAB: 200 mg | ORAL | Qty: 1

## 2017-10-28 MED FILL — SODIUM CHLORIDE 0.9 % IV: INTRAVENOUS | Qty: 1000

## 2017-10-28 MED FILL — FAMOTIDINE (PF) 20 MG/2 ML IV: 20 mg/2 mL | INTRAVENOUS | Qty: 2

## 2017-10-28 MED FILL — HEPARIN (PORCINE) 1,000 UNIT/ML IJ SOLN: 1000 unit/mL | INTRAMUSCULAR | Qty: 10

## 2017-10-28 MED FILL — BD POSIFLUSH NORMAL SALINE 0.9 % INJECTION SYRINGE: INTRAMUSCULAR | Qty: 40

## 2017-10-28 NOTE — Progress Notes (Signed)
Medical Progress Note      NAME: [name] [name]   DOB:  03/24/1956  MRN:             [id number]    Date/Time: 10/28/2017  10:21 PM      Assessment:     1. Chest pain  2. Hypotension probably hypovolemic   3. CAD s/p  recent PCI to LAD and PTCA to Diagonal.   4. Hyperlipidemia    5. Hypothyroidism,   6. GERD,   7. RA    Plan:   ?? Continue current medical management-ASA, brilanta, statin  ?? BP is stable off pressors  ?? Being transferred out of ICU  ?? Cardiology also following    Subjective:   No chest pain today    Objective:     Vitals:    Last 24hrs VS reviewed since prior progress note. Most recent are:  Visit Vitals  BP 118/82 (BP Patient Position: Standing)   Pulse 92   Temp 97.8 ??F (36.6 ??C)   Resp 20   Ht '5\' 5"'  (1.651 m)   Wt 112.9 kg (249 lb)   SpO2 99%   BMI 41.44 kg/m??     SpO2 Readings from Last 6 Encounters:   10/28/17 99%            Intake/Output Summary (Last 24 hours) at 10/28/2017 2221  Last data filed at 10/28/2017 1700  Gross per 24 hour   Intake 1417.24 ml   Output 1400 ml   Net 17.24 ml        Exam:     General:   Not in acute distress  HEENT: PERRLA, Neck Supple,  No JVD  Respiratory:   CTA bilaterally-no wheezes, rales, rhonchi, or crackles  Cardiac:  Regular Rate and Rythmn  - no murmurs, rubs or gallops  Abdominal:  Soft, non-tender, non-distended, positive bowel sounds  Extremities:  No cyanosis, or edema.  Skin: No rash  Neurological:  No focal neurological deficits    Medication:   Current Medications Reviewed    Current Facility-Administered Medications:   ???  thyroid (Pork) (ARMOUR) tablet 120 mg, 120 mg, Oral, 6am, Perello, Michelle M, DO  ???  sodium chloride (NS) flush 5-40 mL, 5-40 mL, IntraVENous, Q8H, Veary, Leanne, PA-C, 10 mL at 10/28/17 2111  ???  sodium chloride (NS) flush 5-40 mL, 5-40 mL, IntraVENous, PRN, Ruffin Frederick, PA-C  ???  hydroxychloroquine (PLAQUENIL) tablet 200 mg, 200 mg, Oral, DAILY WITH BREAKFAST, Veary, Leanne, PA-C, 200 mg at 10/28/17 0849   ???  aspirin chewable tablet 81 mg, 81 mg, Oral, DAILY, Veary, Leanne, PA-C, 81 mg at 10/28/17 0849  ???  famotidine (PF) (PEPCID) 20 mg in sodium chloride 0.9% 10 mL injection, 20 mg, IntraVENous, Q12H, Veary, Leanne, PA-C, 20 mg at 10/28/17 2109  ???  ticagrelor (BRILINTA) tablet 90 mg, 90 mg, Oral, Q12H, Veary, Leanne, PA-C, 90 mg at 10/28/17 2112  ???  simvastatin (ZOCOR) tablet 20 mg, 20 mg, Oral, QHS, Veary, Leanne, PA-C, 20 mg at 10/28/17 2114  ???  pramipexole (MIRAPEX) tablet 1 mg, 1 mg, Oral, QHS, Veary, Leanne, PA-C, 1 mg at 10/28/17 2109  ???  glucose chewable tablet 16 g, 4 Tab, Oral, PRN, Elza Rafter, Leanne, PA-C  ???  glucagon (GLUCAGEN) injection 1 mg, 1 mg, IntraMUSCular, PRN, Veary, Leanne, PA-C  ???  dextrose (D50W) injection syrg 12.5-25 g, 25-50 mL, IntraVENous, PRN, Ruffin Frederick, PA-C      Lab:     Lab Data Reviewed: (  see below)  Recent Results (from the past 24 hour(s))   HEPATIC FUNCTION PANEL    Collection Time: 10/27/17 10:30 PM   Result Value Ref Range    Protein, total 6.1 (L) 6.4 - 8.2 g/dL    Albumin 3.4 3.4 - 5.0 g/dL    Globulin 2.7 2.0 - 4.0 g/dL    A-G Ratio 1.3 0.8 - 1.7      Bilirubin, total 0.4 0.2 - 1.0 MG/DL    Bilirubin, direct 0.1 0.0 - 0.2 MG/DL    Alk. phosphatase 73 45 - 117 U/L    AST (SGOT) 12 (L) 15 - 37 U/L    ALT (SGPT) 19 13 - 56 U/L   MAGNESIUM    Collection Time: 10/27/17 10:30 PM   Result Value Ref Range    Magnesium 2.0 1.6 - 2.6 mg/dL   CALCIUM, IONIZED    Collection Time: 10/27/17 10:30 PM   Result Value Ref Range    Ionized Calcium 1.16 1.12 - 1.32 MMOL/L   TSH 3RD GENERATION    Collection Time: 10/27/17 10:30 PM   Result Value Ref Range    TSH 18.30 (H) 0.36 - 3.74 uIU/mL   T4, FREE    Collection Time: 10/27/17 10:30 PM   Result Value Ref Range    T4, Free 0.6 (L) 0.7 - 1.5 NG/DL   NT-PRO BNP    Collection Time: 10/27/17 10:30 PM   Result Value Ref Range    NT pro-BNP 135 0 - 900 PG/ML   PTT    Collection Time: 10/27/17 10:30 PM   Result Value Ref Range     aPTT 35.3 23.0 - 36.4 SEC   PTT    Collection Time: 10/28/17  4:00 AM   Result Value Ref Range    aPTT 86.5 (H) 23.0 - 36.4 SEC   CBC WITH AUTOMATED DIFF    Collection Time: 10/28/17  4:00 AM   Result Value Ref Range    WBC 5.8 4.6 - 13.2 K/uL    RBC 3.62 (L) 4.20 - 5.30 M/uL    HGB 11.0 (L) 12.0 - 16.0 g/dL    HCT 33.6 (L) 35.0 - 45.0 %    MCV 92.8 74.0 - 97.0 FL    MCH 30.4 24.0 - 34.0 PG    MCHC 32.7 31.0 - 37.0 g/dL    RDW 14.6 (H) 11.6 - 14.5 %    PLATELET 189 135 - 420 K/uL    MPV 12.5 (H) 9.2 - 11.8 FL    NEUTROPHILS 66 40 - 73 %    LYMPHOCYTES 23 21 - 52 %    MONOCYTES 8 3 - 10 %    EOSINOPHILS 2 0 - 5 %    BASOPHILS 1 0 - 2 %    ABS. NEUTROPHILS 3.8 1.8 - 8.0 K/UL    ABS. LYMPHOCYTES 1.3 0.9 - 3.6 K/UL    ABS. MONOCYTES 0.5 0.05 - 1.2 K/UL    ABS. EOSINOPHILS 0.1 0.0 - 0.4 K/UL    ABS. BASOPHILS 0.0 0.0 - 0.1 K/UL    DF AUTOMATED     METABOLIC PANEL, BASIC    Collection Time: 10/28/17  4:00 AM   Result Value Ref Range    Sodium 142 136 - 145 mmol/L    Potassium 3.4 (L) 3.5 - 5.5 mmol/L    Chloride 112 (H) 100 - 108 mmol/L    CO2 22 21 - 32 mmol/L    Anion gap 8 3.0 - 18  mmol/L    Glucose 86 74 - 99 mg/dL    BUN 18 7.0 - 18 MG/DL    Creatinine 0.88 0.6 - 1.3 MG/DL    BUN/Creatinine ratio 20 12 - 20      GFR est AA >60 >60 ml/min/1.60m    GFR est non-AA >60 >60 ml/min/1.75m   Calcium 7.9 (L) 8.5 - 10.1 MG/DL   CALCIUM, IONIZED    Collection Time: 10/28/17  4:00 AM   Result Value Ref Range    Ionized Calcium 1.19 1.12 - 1.32 MMOL/L   MAGNESIUM    Collection Time: 10/28/17  4:00 AM   Result Value Ref Range    Magnesium 2.2 1.6 - 2.6 mg/dL   PHOSPHORUS    Collection Time: 10/28/17  4:00 AM   Result Value Ref Range    Phosphorus 2.9 2.5 - 4.9 MG/DL   EKG, 12 LEAD, SUBSEQUENT    Collection Time: 10/28/17  7:11 AM   Result Value Ref Range    Ventricular Rate 80 BPM    Atrial Rate 80 BPM    P-R Interval 192 ms    QRS Duration 90 ms    Q-T Interval 386 ms    QTC Calculation (Bezet) 445 ms     Calculated P Axis 49 degrees    Calculated R Axis 84 degrees    Calculated T Axis 79 degrees    Diagnosis       Sinus rhythm with premature atrial complexes  Otherwise normal ECG  When compared with ECG of 27-Oct-2017 17:42,  QT has shortened  Confirmed by VeFredirick [phone] 10/28/2017 9:30:59 AM     GLUCOSE, POC    Collection Time: 10/28/17 12:26 PM   Result Value Ref Range    Glucose (POC) 70 70 - 110 mg/dL   ECHO ADULT FOLLOW-UP OR LIMITED    Collection Time: 10/28/17  1:26 PM   Result Value Ref Range    LVIDd 3.92 3.9 - 5.3 cm    LVPWd 1.21 (A) 0.6 - 0.9 cm    LVIDs 2.25 cm    IVSd 1.21 (A) 0.6 - 0.9 cm    LV ED Vol A2C 73.7 mL    LV ES Vol A4C 23.7 mL    LV ES Vol BP 25.1 19 - 49 mL    BP EF 64.9 55 - 100 %    LV Ejection Fraction MOD 4C 65 %    LV Ejection Fraction MOD 2C 65 %    LV Mass AL 189.3 (A) 67 - 162 g    LV Mass AL Index 87.2 43 - 95 g/m2    LV ES Vol A2C 25.8 mL    LVES Vol Index BP 11.6 mL/m2    LV ED Vol A4C 68.2 mL    LVED Vol Index BP 33.0 mL/m2    Triscuspid Valve Regurgitation Peak Gradient 24.3 mmHg    LV ED Vol BP 71.6 56 - 104 ml    TR Max Velocity 246.55 cm/s    LVED Vol Index A4C 31.4 mL/m2    LVED Vol Index A2C 33.9 mL/m2    LVES Vol Index A4C 10.9 mL/m2    LVES Vol Index A2C 11.9 mL/m2    PASP 27.0 mmHg   GLUCOSE, POC    Collection Time: 10/28/17  5:19 PM   Result Value Ref Range    Glucose (POC) 89 70 - 110 mg/dL       No results found.  Active Problems:    Hypotension (10/27/2017)      Hypotension due to hypovolemia (10/27/2017)      CAD (coronary artery disease) (10/28/2017)      Chest pain (10/28/2017)      ____________________________________________________________________      Attending Physician: Stanton Kidney, MD

## 2017-10-28 NOTE — Other (Signed)
Bedside shift change report given to  (oncoming nurse) by Magdalen Spatz (offgoing nurse). Report included the following information SBAR and Kardex.     Pt alert. In bed. No complaints of chest pain. Bed in the lowest position. Call bell within reach. Will continue to monitor.

## 2017-10-28 NOTE — Progress Notes (Addendum)
PCCM Update    PIV placed by nursing. Will discontinue CVL before transfer to telemetry unit.    Meyer Russel, NP       ICU Staff:  Above noted and reviewed    Cecil Cranker, DO, FCCP    [facility]  Pulmonary, Critical Care, and Sleep Medicine

## 2017-10-28 NOTE — Other (Signed)
TRANSFER - OUT REPORT:    Verbal report given to [facility] RN(name) on [name] [name]  being transferred to [facility](unit) for routine progression of care       Report consisted of patient???s Situation, Background, Assessment and   Recommendations(SBAR).     Information from the following report(s) SBAR, Kardex, Intake/Output, Recent Results and Cardiac Rhythm . was reviewed with the receiving nurse.    Lines:   Peripheral IV 10/28/17 Right;Posterior Forearm (Active)        Opportunity for questions and clarification was provided.      Patient transported with:   Monitor  Registered Nurse

## 2017-10-28 NOTE — Other (Signed)
2215- There was a delay in starting [name] [name]'s Heparin drip, ED gave initial heparin bolus 1430 but did not start the drip. Per Pharmacy another PTT was needed, prior to starting drip.     Heparin drip started with bolus per protocol at 0140.

## 2017-10-28 NOTE — Progress Notes (Signed)
TRANSFER - IN REPORT:    Verbal report received from [facility], RN(name) on VADIE PRINCIPATO  being received from ICU(unit) for routine progression of care      Report consisted of patient???s Situation, Background, Assessment and   Recommendations(SBAR).     Information from the following report(s) SBAR, Kardex and Med Rec Status was reviewed with the receiving nurse.    Opportunity for questions and clarification was provided.      Assessment completed upon patient???s arrival to unit and care assumed.

## 2017-10-28 NOTE — Progress Notes (Addendum)
Designer, jewellery.  Pulmonary, Critical Care, and Sleep Medicine  Name: [name] [name] MRN: [id number]   DOB: [birth year] Hospital: [facility]   Date: 10/28/2017  Admission Date: 10/27/2017     Chart and notes reviewed. Data reviewed. I have evaluated all findings.    [x] I have reviewed the flowsheet and previous day???s notes.    [name] [name]??is a 62 y.o.??female??with pmhx of STEMI s/p DES of LAD, diastolic CHF, hypothyroidism, GERD, and RA who presnted to the [facility] ER on 10/27/17 with mid-sternal 10/10 chest pain radiating to her back that started 2 hrs PTA. Pt was negative for ACS, but admitted to ICU for hypotension/shock requiring vasopressors.     10/28/17  Mentation: A&Ox 4 in NAD  Respiratory/ Secretions: none  Hemodynamics: Remains on low dose levophed  Urine output: Adequate overnight  Need for procedures: Possible Cardiac Cath per Cards-pt NPO.      -Chest pain improved to 1/10, SOB resolved. Denies palpitations, peripheral edema, orthopnea, cough, sputum production, hemoptysis, wheezing, dyspnea, n/v/d, fever/chills, abdominal pain, dizziness, or paresthesia.              BJS:EGBTDVVOH items are noted in HPI.    Events and notes from last 24 hours reviewed. Care plan discussed on multidisciplinary rounds.  Patient Active Problem List   Diagnosis Code   ??? Hypotension I95.9   ??? Hypotension due to hypovolemia I95.89, E86.1     Vital Signs:  Visit Vitals  BP 102/59   Pulse 77   Temp 98 ??F (36.7 ??C)   Resp 15   Wt 112.9 kg (249 lb)   SpO2 99%               Temp (24hrs), Avg:98.1 ??F (36.7 ??C), Min:97.8 ??F (36.6 ??C), Max:98.4 ??F (36.9 ??C)     Intake/Output:   Last shift:      No intake/output data recorded.  Last 3 shifts: 01/21 0701 - 01/22 1900  In: 2001.1 [I.V.:2001.1]  Out: -     Intake/Output Summary (Last 24 hours) at 10/28/2017 0144  Last data filed at 10/27/2017 2200  Gross per 24 hour   Intake 2001.14 ml   Output 0 ml   Net 2001.14 ml       Current Facility-Administered Medications   Medication Dose Route Frequency   ??? heparin 25,000 units in D5W 250 ml infusion  8-25 Units/kg/hr IntraVENous TITRATE   ??? NOREPINephrine (LEVOPHED) 8 mg in 5% dextrose 268mL infusion  2-16 mcg/min IntraVENous TITRATE   ??? sodium chloride (NS) flush 5-40 mL  5-40 mL IntraVENous Q8H   ??? hydroxychloroquine (PLAQUENIL) tablet 200 mg  200 mg Oral DAILY WITH BREAKFAST   ??? aspirin chewable tablet 81 mg  81 mg Oral DAILY   ??? famotidine (PF) (PEPCID) 20 mg in sodium chloride 0.9% 10 mL injection  20 mg IntraVENous Q12H   ??? [START ON 10/29/2017] thyroid (Pork) (ARMOUR) tablet 90 mg  90 mg Oral DAILY   ??? ticagrelor (BRILINTA) tablet 90 mg  90 mg Oral Q12H   ??? simvastatin (ZOCOR) tablet 20 mg  20 mg Oral QHS   ??? pramipexole (MIRAPEX) tablet 1 mg  1 mg Oral QHS   ??? heparin (porcine) 1,000 unit/mL injection 3,000 Units  3,000 Units IntraVENous ONCE     Telemetry: NSR    Physical Exam:    General: in no apparent distress, well developed and well nourished, alert and oriented times 3   HEENT: Normal   Neck:  No abnormally enlarged lymph nodes.   Chest: normal   Lungs: normal air entry, no dullness/ tenderness/ rash   Heart: Regular rate and rhythm   Abdomen: non distended, bowel sounds normoactive, tympanic, abdomen is soft without significant tenderness, masses, organomegaly or guarding   Extremity: negative   Neuro: alert   Skin: Skin color, texture, turgor normal. No rashes or lesions               Devices: Right IJ CVL- accessed   DATA:  MAR reviewed and pertinent medications noted or modified as needed    Labs:  Recent Labs     10/27/17  1824 10/27/17  1625 10/27/17  1410   WBC 6.4 8.1 6.2   HGB 11.5* 11.6* 11.2*   HCT 34.8* 35.8 33.9*   PLT 190 203 187     Recent Labs     10/27/17  2230 10/27/17  1824 10/27/17  1410   NA  --  141 143   K  --  4.0 3.9   CL  --  110* 113*   CO2  --  21 22   GLU  --  75 78   BUN  --  23* 22*   CREA  --  1.25 1.53*   CA  --  7.6* 7.4*   MG 2.0  --   --     ALB 3.4  --   --    SGOT 12*  --   --    ALT 19  --   --      No results for input(s): PH, PCO2, PO2, HCO3, FIO2 in the last 72 hours.  No results for input(s): FIO2I, IFO2, HCO3I, IHCO3, HCOPOC, PCO2I, PCOPOC, IPHI, PHI, PHPOC, PO2I, PO2POC in the last 72 hours.    No lab exists for component: IPOC2    Imaging:  [x]    I have personally reviewed the patient???s radiographs and reports  XR Results (most recent):  Results from Hospital Encounter encounter on 10/27/17   XR CHEST PORT    Narrative Description:  Portable supine chest radiograph, single view    Clinical Indication:  Line placement. Check position.    Comparison: October 27, 2017.    Findings:  There is been interval placement of a right IJ central venous line  which extends to the peripheral SVC. No pneumothorax is seen. No focal airspace  opacity or effusion. Cardiomediastinal silhouette within normal limits..      Impression Impression:   Right IJ central venous line extends to the peripheral SVC. No pneumothorax.           CT Results (most recent):  Results from Hospital Encounter encounter on 10/02/10   CT HEAD WO CONT    Narrative CAT 4500 - HEAD W/O CONTRAST   -  Oct 02 2010   RESULTS:  HISTORY: HYPERTENSION AND HEADACHE   5 MM AXIAL IMAGES WERE OBTAINED THROUGH THE HEAD WITHOUT  IV CONTRAST.   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 NO HEMORRHAGE   MASS-EFFECT OR   INFARCT IS SEEN. THE BONY STRUCTURES ARE INTACT.   IMPRESSION:   CEREBRAL ATROPHY. NO ACUTE ABNORMALITY.   INTERPRETING PHYSICIAN: DAVID Minta Balsam M.D.   ELECTRONICALLY SIGNED  BY / DATE: Boykin Reaper M.D. [facility] 28 2011 10:45A     IMPRESSION:   ?? Hypotension- improved to IVF, likely component of hypovolemia from fluid restriction and medication induced (nitro,dilaudid), +Orthostatics, remains on low dose levophed  ?? Chest Pain- demand  ischemia vs. ACS/angina, on heparin gtt per cards   ?? Acute Kidney Injury- resolved with IVF, pre-renal   ?? Prolonged QTc   ?? Hx of CAD/STEMI with LAD DES placed 08/09/17  ?? Grade I Diastolic Dysfunction-echo 08/2017 EF 60%   ?? Mild Normocytic Anemia   ?? Rheumatoid Arthritis   ?? Hyperlipidemia   ?? Hypothyroidism   ?? Hx of Pseudotumor Cerebri      Patient Active Problem List   Diagnosis Code   ??? Hypotension I95.9   ??? Hypotension due to hypovolemia I95.89, E86.1      RECOMMENDATIONS:   ?? Resp:  Supplemental oxygen as needed. PRN CXR or ABG for respiratory distress.    ?? I/D: Trend WBC and Temp Curve. F/U blood cultures, no acute indication for ABX at this time.   ?? Hem/Onc: Trend H&H and platelets, PTT as per heparin protocol. Monitor closely for bleeding.   ?? CVS: Monitor Hemodynamics. Recheck EKG to eval QTcTitrate levophed with MAP goal > 65. Hold PO anti-hypertensives for now. Continue Brilinta, statin, aspirin, and heparin gtt per Cardiology. Possible Cardiac Cath today.   ?? Metabolic: Trend lytes and replace per provider. BMP, Mg, Ca, Phos.   ?? Renal: Trend Cr and UOP. Strict I&Os.    ?? Endocrine: Monitor for hypoglycemia while NPO. Restart Thyroid Hormones.   ?? GI: NPO for now, cardiac diet with fluid restriction after cath.   ?? Musc/Skin: Continue Plaquenil.   ?? Neuro: Monitor neuro status.  ?? Code Status: Full Code     Best practice :  Glycemic control  IHI ICU bundles:   Central Line Bundle Followed     Sress ulcer prophylaxis- Pepcid for GERD  DVT prophylaxis- SCDs/On Heparin gtt   Need for Lines- Right IJ CVL for vasopressors     High complexity decision making was performed during this consultation and evaluation.  [x]        Pt is at high risk for further organ failure and dysfunction.       Signed By: [name] Frederick, [name] [name]     October 28, 2017 6:07 AM     [facility] Staff Addendum     I have independently evaluated the patient and reviewed the patient's chart.    I have discussed the findings and care plan with ICU Care Team. Care Plan reviewed on ICU milti-D rounds.     I agree with the above evaluation, assessment and recommendations along with the following comments and observations. Hemodynamics much improved. Orthostatics negative this afternoon. Patient got up on commode earlier today- at that time she reported some dizziness but no associated hypotension.    Patient reports her Coreg OP dose was recently increased. No findings of acute coronary process. Cardiology following patient.    Her TSH is elevated and T4 decreased. She is s/p thyroidectomy at age 77. She had been on synthroid but this was switched to Amour and per the patient's report her endocrinologist was planning to increase her daily dose of 90 mg.     She has chronic intermittent difficulty with swallowing - especially red meat/steak which she avoids. She does have GERD which has improved on medication but she is having breakthrough symptoms at least twice weekly.    At this time she has remained hemodynamically stable. No ongoing need for ICU care. Plan to transfer to telemetry floor.  We have increased her Armour dose. Consider further GI evaluation.          Critical  Care and time spent coordinating care, minus procedure time: 30  min    [name] Cranker, [name] [name], [name] [name]  Pulmonary, Sleep and Critical Care Medicine  6:28 PM

## 2017-10-28 NOTE — Other (Signed)
Tilt Test results  0406 Laying-   HR- 80  BP- 113/59  O2-100    0408 Sitting-  HR- 84   BP- 125/75  O2-100    0410 Standing-  HR- 98  BP- 108/74  O2-99    Patient's tilt test is positive- PA Leanne Veary notified.

## 2017-10-28 NOTE — Progress Notes (Addendum)
[facility]  Pulmonary, Critical Care, and Sleep Medicine    Transfer Summary     See progress PCCM progress note on 10/28/17 for further details    New attending MD: Dr. Delene Loll    Consults: Cardiology is following  Procedures: Right IJ CVL in ED 10/27/17     Review:     Rationale  Recommendations    DVT Proph:   Is DVT prophylaxis still   required? No  Ulcer Prophylaxis  PPI / H2 required after transfer? Yes  Reason?   Change to PO Pepcid    Patient is on Brilinta for past PCI     Renal/  hepatic Medications need adjustment:  None   Cardiac Pressors DC from [facility]?  AMI: (ASA, ACEI/ARB, Statin, B-blocker):  Continue Zocor  Continue ASA  Heparin gtt discontinued  Further recommendations per cardiology   ID Antibiotics listed:          Coverage Appropriate?        Candidate for Streamlining?       Recommendations for duration of therapy: No Abx        Endocrine  Insulin coverage/thyroid appropriate? Patient restarted on Armour tonight for hypothyroidism. Dose increased from 90mg  to 120mg  today. Patient follows with an endocrinologist as outpatient.   Pain  medications  Are current pain medications appropriate for the floor?  No pain meds ordered   Home   Medications  Recommendations for revision:  None   Central lines  Right IJ CVL- lack of venous access, Nursing unable to place PIVs. Ok to go to floor with CVL. May benefit from PICC tomorrow if needed. Hospitalist to re-evaluate need for CVL.   Foley   None   Other Devices   None     Dellyn R Matthew AGACNP-BC     Pulmonary, Critical Care Medicine  [facility]    ICU Staff:  Agree with above- please see my comments on H&P  ?? OP Armour dose increased from 90 to 120mg   ?? Consider GI evaluation for report of GERD and intermittent dysphagia    Cecil Cranker, DO, FCCP    [facility]  Pulmonary, Critical Care, and Sleep Medicine

## 2017-10-28 NOTE — Progress Notes (Addendum)
[facility]      CARDIOLOGY PROGRESS NOTE  RECS:    1. Chest pain-resolved normal troponin no EKG changes. No ACS- d/c heparin drip.  Continue aspirin/ BB/ brilanta and statin.  2. CAD with recent PCI to LAD and PTCA to Diagonal. On asa and Brilinta. Follow echo report to reassess lvf, rvf   3. Hypotension - requiring low dose vasopressor support-- BP now low normal c/o dizziness. Monitor orthostatics. Hold BP medications  encourage increase po intake. Echo 11/18 showed preserved EF%   4. Dyslipidemia- continue statin   ??  Echo reviewed-- normal LV function with no WMA  Continue supportive care- try and wean pressor.    ASSESSMENT:  Hospital Problems  Never Reviewed          Codes Class Noted POA    Hypotension ICD-10-CM: I95.9  ICD-9-CM: 458.9  10/27/2017 Unknown        Hypotension due to hypovolemia ICD-10-CM: I95.89, E86.1  ICD-9-CM: 458.8, 276.52  10/27/2017 Unknown                SUBJECTIVE:  No CP  No SOB    OBJECTIVE:    VS:   Visit Vitals  BP 107/61   Pulse 81   Temp 97.9 ??F (36.6 ??C)   Resp 18   Ht 5\' 5"  (1.651 m)   Wt 112.9 kg (249 lb)   SpO2 99%   BMI 41.44 kg/m??         Intake/Output Summary (Last 24 hours) at 10/28/2017 1450  Last data filed at 10/28/2017 1400  Gross per 24 hour   Intake 2178.38 ml   Output 1400 ml   Net 778.38 ml     TELE: normal sinus rhythm    General: alert, well developed, pleasant and in no apparent distress  HENT: Normocephalic, atraumatic.  Normal external eye.  Neck :  JVD difficult to assess due to obesity  Cardiac:  regular rate and rhythm, S1, S2 normal, no murmur, click, rub or gallop  Lungs: clear to auscultation bilaterally  Abdomen: Soft, nontender, no masses  Extremities:  No c/c/e, peripheral pulses present      Labs: Results:       Chemistry Recent Labs     10/28/17  0400 10/27/17  2230 10/27/17  1824 10/27/17  1410   GLU 86  --  75 78   NA 142  --  141 143   K 3.4*  --  4.0 3.9   CL 112*  --  110* 113*   CO2 22  --  21 22   BUN 18  --  23* 22*    CREA 0.88  --  1.25 1.53*   CA 7.9*  --  7.6* 7.4*   AGAP 8  --  10 8   BUCR 20  --  18 14   AP  --  73  --   --    TP  --  6.1*  --   --    ALB  --  3.4  --   --    GLOB  --  2.7  --   --    AGRAT  --  1.3  --   --       CBC w/Diff Recent Labs     10/28/17  0400 10/27/17  1824 10/27/17  1625   WBC 5.8 6.4 8.1   RBC 3.62* 3.76* 3.84*   HGB 11.0* 11.5* 11.6*   HCT 33.6* 34.8* 35.8  PLT 189 190 203   GRANS 66 73 75*   LYMPH 23 18* 18*   EOS 2 1 1       Cardiac Enzymes No results for input(s): CPK, CKND1, MYO in the last 72 hours.    No lab exists for component: CKRMB, TROIP   Coagulation Recent Labs     10/28/17  0400 10/27/17  2230   APTT 86.5* 35.3       Lipid Panel No results found for: CHOL, CHOLPOCT, CHOLX, CHLST, CHOLV, 884269, HDL, LDL, LDLC, DLDLP, 119417, VLDLC, VLDL, TGLX, TRIGL, TRIGP, TGLPOCT, CHHD, CHHDX   BNP No results for input(s): BNPP in the last 72 hours.   Liver Enzymes Recent Labs     10/27/17  2230   TP 6.1*   ALB 3.4   AP 73   SGOT 12*      Thyroid Studies Lab Results   Component Value Date/Time    TSH 18.30 (H) 10/27/2017 10:30 PM              [place] NP-C Cel:[phone] supervised    I have independently evaluated and examined the patient.  All relevant labs and testing data's are reviewed.  Care plan discussed and updated after review.    Fredirick Maudlin MD

## 2017-10-28 NOTE — Progress Notes (Signed)
Echocardiogram completed at bedside. Report to follow.    Bethany Upham RCS

## 2017-10-28 NOTE — Progress Notes (Addendum)
Reason for Admission:  Hypotension  Hypotension due to hypovolemia                 RRAT Score:    7           Plan for utilizing home health:    No home health orders at this time. The patient is in the ICU setting.                    Likelihood of Readmission:   LOW                         Transition of Care Plan:          Initial assessment completed with patient. Cognitive status of patient: oriented to time, place, person and situation.     Face sheet information confirmed:  yes, PCP, home telephone number, and apt. number updated.  This patient lives in an apartment alone; her apartment building has a Media planner.  Patient is not able to navigate steps due to SOB.  Prior to hospitalization, patient was considered to be independent with ADLs/IADLS : yes . The patient ambulates w/o assistance or DME. She states that she can obtain her medications from the pharmacy, and that she takes her medications as directed; she uses a med. box.    Patient has a current ACP document on file: yes  The patient's family will be available to transport patient home upon discharge.   The patient has no DME at home.     Patient is not currently active with home health. Patient has not stayed in a skilled nursing facility or rehab.    Currently, the discharge plan is to be determined.    Patient's current insurance is private insurance through her job: [employer] where she works full time. She states that she has the information at home; the patient states that she told Patient Access staff this morning that she would get the information for them.      Care Management Interventions  PCP Verified by CM: Yes(Last seen by PCP in approx. Dec. 2018)  Palliative Care Criteria Met (RRAT>21 & CHF Dx)?: No  Mode of Transport at Discharge: Self  Transition of Care Consult (CM Consult): Discharge Planning  Discharge Durable Medical Equipment: No  Physical Therapy Consult: No  Occupational Therapy Consult: No   Speech Therapy Consult: No  Current Support Network: Lives Alone, Family Lives Nearby(Her husband has Alzheimer's, and he lives in a [place] in [place].)  [place] Follow Up Transport: Family  Plan discussed with Pt/Family/Caregiver: Yes  Discharge Location  Discharge Placement: Unable to determine at this time      Eula Listen MSN, RN, [place]

## 2017-10-28 NOTE — Other (Signed)
Bedside and Verbal shift change report given to Charlott Rakes RN (oncoming nurse) by Samantha Crimes RN (offgoing nurse). Report included the following information SBAR, Kardex, ED Summary, [facility] and Recent Results.

## 2017-10-28 NOTE — Progress Notes (Signed)
Chaplain attended the interdisciplinary rounds for [name] [name], who is a 62 y.o.,female. Patient???s Primary Language is: [place].   According to the patient???s EMR Religious Affiliation is: Catholic.     The reason the Patient came to the hospital is:   Patient Active Problem List    Diagnosis Date Noted   ??? Hypotension 10/27/2017   ??? Hypotension due to hypovolemia 10/27/2017      Plan:  Chaplains will continue to follow and will provide pastoral care on an as needed/requested basis.  Chaplain recommends bedside caregivers page chaplain on duty if patient shows signs of acute spiritual or emotional distress.    [place]  Board Certified [place]   [phone]

## 2017-10-29 LAB — EKG, 12 LEAD, SUBSEQUENT
Atrial Rate: 73 {beats}/min
Calculated P Axis: 42 degrees
Calculated R Axis: 74 degrees
Calculated T Axis: 80 degrees
P-R Interval: 172 ms
Q-T Interval: 412 ms
QRS Duration: 92 ms
QTC Calculation (Bezet): 453 ms
Ventricular Rate: 73 {beats}/min

## 2017-10-29 LAB — CBC WITH AUTOMATED DIFF
ABS. BASOPHILS: 0 10*3/uL (ref 0.0–0.1)
ABS. EOSINOPHILS: 0.1 10*3/uL (ref 0.0–0.4)
ABS. LYMPHOCYTES: 1.1 10*3/uL (ref 0.9–3.6)
ABS. MONOCYTES: 0.5 10*3/uL (ref 0.05–1.2)
ABS. NEUTROPHILS: 3.6 10*3/uL (ref 1.8–8.0)
BASOPHILS: 0 % (ref 0–2)
EOSINOPHILS: 2 % (ref 0–5)
HCT: 35.8 % (ref 35.0–45.0)
HGB: 12 g/dL (ref 12.0–16.0)
LYMPHOCYTES: 21 % (ref 21–52)
MCH: 30.8 PG (ref 24.0–34.0)
MCHC: 33.5 g/dL (ref 31.0–37.0)
MCV: 92 FL (ref 74.0–97.0)
MONOCYTES: 9 % (ref 3–10)
MPV: 12.3 FL — ABNORMAL HIGH (ref 9.2–11.8)
NEUTROPHILS: 68 % (ref 40–73)
PLATELET: 176 10*3/uL (ref 135–420)
RBC: 3.89 M/uL — ABNORMAL LOW (ref 4.20–5.30)
RDW: 14.7 % — ABNORMAL HIGH (ref 11.6–14.5)
WBC: 5.4 10*3/uL (ref 4.6–13.2)

## 2017-10-29 LAB — METABOLIC PANEL, BASIC
Anion gap: 5 mmol/L (ref 3.0–18)
BUN/Creatinine ratio: 16 (ref 12–20)
BUN: 12 MG/DL (ref 7.0–18)
CO2: 24 mmol/L (ref 21–32)
Calcium: 8.5 MG/DL (ref 8.5–10.1)
Chloride: 111 mmol/L — ABNORMAL HIGH (ref 100–108)
Creatinine: 0.76 MG/DL (ref 0.6–1.3)
GFR est AA: >60 mL/min/{1.73_m2} (ref >60)
GFR est non-AA: >60 mL/min/{1.73_m2} (ref >60)
Glucose: 96 mg/dL (ref 74–99)
Potassium: 3.7 mmol/L (ref 3.5–5.5)
Sodium: 140 mmol/L (ref 136–145)

## 2017-10-29 LAB — CALCIUM, IONIZED: Ionized Calcium: 1.22 MMOL/L (ref 1.12–1.32)

## 2017-10-29 LAB — GLUCOSE, POC
Glucose (POC): 107 mg/dL (ref 70–110)
Glucose (POC): 102 mg/dL (ref 70–110)

## 2017-10-29 LAB — MAGNESIUM: Magnesium: 2 mg/dL (ref 1.6–2.6)

## 2017-10-29 LAB — PHOSPHORUS: Phosphorus: 2.9 MG/DL (ref 2.5–4.9)

## 2017-10-29 LAB — EKG 12-LEAD
Atrial Rate: 73 {beats}/min
P Axis: 42 degrees
P-R Interval: 172 ms
Q-T Interval: 412 ms
QRS Duration: 92 ms
QTc Calculation (Bazett): 453 ms
R Axis: 74 degrees
T Axis: 80 degrees
Ventricular Rate: 73 {beats}/min

## 2017-10-29 MED ORDER — MIDODRINE 5 MG TAB
5 mg | ORAL_TABLET | Freq: Two times a day (BID) | ORAL | 0 refills | Status: DC
Start: 2017-10-29 — End: 2017-11-12

## 2017-10-29 MED ORDER — MIDODRINE 5 MG TAB
5 mg | Freq: Two times a day (BID) | ORAL | Status: DC
Start: 2017-10-29 — End: 2017-10-29

## 2017-10-29 MED ORDER — PRAMIPEXOLE 1 MG TAB
1 mg | ORAL_TABLET | Freq: Every evening | ORAL | 0 refills | Status: AC
Start: 2017-10-29 — End: ?

## 2017-10-29 MED FILL — ASPIRIN 81 MG CHEWABLE TAB: 81 mg | ORAL | Qty: 1

## 2017-10-29 MED FILL — FAMOTIDINE (PF) 20 MG/2 ML IV: 20 mg/2 mL | INTRAVENOUS | Qty: 2

## 2017-10-29 MED FILL — BRILINTA 90 MG TABLET: 90 mg | ORAL | Qty: 1

## 2017-10-29 MED FILL — PRAMIPEXOLE 0.25 MG TAB: 0.25 mg | ORAL | Qty: 4

## 2017-10-29 MED FILL — ARMOUR THYROID 30 MG TABLET: 30 mg | ORAL | Qty: 4

## 2017-10-29 MED FILL — SIMVASTATIN 20 MG TAB: 20 mg | ORAL | Qty: 1

## 2017-10-29 MED FILL — HYDROXYCHLOROQUINE 200 MG TAB: 200 mg | ORAL | Qty: 1

## 2017-10-29 NOTE — Discharge Summary (Signed)
[facility]  Discharge Summary       Patient: [name] [name] Age: 63 y.o. DOB: November 21, 1955 MR#: [id number] SSN: [id number]  PCP on record: Elby Showers, MD  Admit date: 10/27/2017  Discharge date: 10/29/17    Consults:  -Dr Sula Rumple, Lenna Sciara.,- cardiology  -   Procedures: None  -     Significant Diagnostic Studies: -CTA chest abd pelv 10/27/17:  IMPRESSION:     1. Normal aorta with no dissection or aneurysmal dilatation.    2. No acute lung or chest finding to explain patient's symptom.    3. Status post cholecystectomy and hysterectomy. Unremarkable abdomen and pelvis  otherwise.     Thank you for your referral.   - Cardiac echo 10/28/17:  Interpretation Summary      Estimated left ventricular ejection fraction is 61 - 65%. Biplane method used to measure ejection fraction. Left ventricular mild concentric hypertrophy. Normal left ventricular wall motion, no regional wall motion abnormality noted.   There is no evidence of pulmonary hypertension.        Discharge Diagnoses:  -Chest pain-resolved, ACS thought to be unlikely  - Hypotension- improved                                        Patient Active Problem List   Diagnosis Code   . Hypotension I95.9   . Hypotension due to hypovolemia I95.89, E86.1   . CAD (coronary artery disease) I25.10   . Chest pain R07.9       Hospital Course by Problem     24 y o female presented to the ED w/ cc of mid sternal10/10 cp w/ radiations to her back, associated w/ worsening SOB. Pt woke up with symptoms which worsened at work, EMS was called, b/c her bp at work was 80/50. Pt w/ h/o labile bp's, pt reported h/o labile bp when coreg was increased and imdur was started. Pt reported taking 2 nitro'day due to angina that cardiologist is aware of. When she arrived at the ER bp was 87/49, with hr of 70, she eats strickt low Na diet. In the ED, her bp was 87/49, hr 70. EKG showed prolonged QTc, trop - X2, initial infectious w/u was negative,  CTA chest negative for PE. She remained w/ mild hypotension in the ED, CBL was placed in R IG and she was started on levophed and was transferred to the ICU.  After a brief stay in the ICU, she was tranferred out to tele monitored word. Her imdur and antihypertensives were held and to avoid recurrence of hypotension as w/u for other causes was negative. She cont'd to do well and soon after downgrade out of ICU, her orthostatic vital signs were within acceptable range. She will be dc'd home w/ instruction to not take her bp meds for at least a week and to f/u w/ her nephrologist before making final decisions on which med to take.            Today's examination of the patient revealed:     Subjective:     Objective:   VS:   Visit Vitals  BP 128/62 (BP 1 Location: Right arm, BP Patient Position: At rest)   Pulse 64   Temp 97.8 F (36.6 C)   Resp 20   Ht 5\' 5"  (1.651 m)   Wt 113.4 kg (250  lb 1.6 oz)   SpO2 99%   BMI 41.62 kg/m      Tmax/24hrs: No data recorded.     Input/Output: No intake or output data in the 24 hours ending 11/02/17 2218    General:  Alert, awake, in nad  Cardiovascular:  Rrr, no murmurs  Pulmonary:  ctab  GI:  Soft, nt, nd  Extremities:  No edema  Additional:      Labs:    No results found for this or any previous visit (from the past 24 hour(s)).  Additional Data Reviewed:     Condition: stable  Disposition:    [x] Home   [] Home with Home Health   [] SNF/NH   [] Rehab   [] Home with family   [] Alternate Facility:____________________      Discharge Medications:   Cannot display discharge medications since this patient is not currently admitted.  Hold imdur, beta blocker, and other bp meds and calcium       Follow-up Appointments:   1. Your PCP: Elby Showers, MD, within 7-10days      >30 minutes spent coordinating this discharge (review instructions/follow-up, prescriptions, preparing report for sign off)    Signed:  Kathlen Mody, MD  11/02/2017  10:18 PM

## 2017-10-29 NOTE — Progress Notes (Signed)
Am EKG not transmitted and visualized

## 2017-10-29 NOTE — Progress Notes (Signed)
EKG completed. Transmission error. Initial EKG in pt chart.     Pt is alert and asymptomatic. Currently watching tv. Bed in the lowest position. Call bell within reach. Report given to [employer].

## 2017-10-29 NOTE — Other (Signed)
Bedside shift change report given to Carmelita RN (oncoming nurse) by  (offgoing nurse). Report included the following information SBAR and Kardex.

## 2017-10-29 NOTE — Progress Notes (Addendum)
Bedside and Verbal shift change report given to this RN (oncoming nurse) by Lucina Mellow (offgoing nurse). Report included the following information SBAR, Kardex and Cardiac Rhythm NSR w/ PAC.       Reviewed discharge paperwork with pat ([id number]), pt stable, and in no distress.  All questions answered.  Pt to be accompanied down to main lobby by patient transport.

## 2017-10-29 NOTE — Progress Notes (Signed)
Chaplain conducted a Follow-up Visit and Spiritual Assessment  for [name] [name], who is a 62 y.o.,female.    Patient is in good spirits. She related how God was watching over her while she continuously was passing out and coming to, til she was able to reach the phone and dial 911. She is also glad that the cardiologist here at [facility] actually listened to her explanation and is responding accordingly. Patient is an Therapist, sports at [employer].  Chaplain offered spiritual support and assurance of prayer. Patient was appreciative of visit. Patient's chart reviewed.  Chaplains will continue to follow and provide pastoral care as needed or requested.      The Rev. Ren?? [place]  [facility] [phone] / [facility] [phone]

## 2017-10-29 NOTE — Discharge Summary (Signed)
[facility] Hospitalist Group  Discharge Summary       Patient: [name] [name] Age: 62 y.o. DOB: May 24, 1956 MR#: [id number] SSN: [id number]  PCP on record: Elby Showers, MD  Admit date: 10/27/2017  Discharge date: 10/29/17    Consults:  -Dr Sula Rumple, Lenna Sciara.,- cardiology  -   Procedures: None  -     Significant Diagnostic Studies: -CTA chest abd pelv 10/27/17:  IMPRESSION:   ??  1. Normal aorta with no dissection or aneurysmal dilatation.  ??  2. No acute lung or chest finding to explain patient's symptom.  ??  3. Status post cholecystectomy and hysterectomy. Unremarkable abdomen and pelvis  otherwise.   ??  Thank you for your referral.   - Cardiac echo 10/28/17:  Interpretation Summary     ?? Estimated left ventricular ejection fraction is 61 - 65%. Biplane method used to measure ejection fraction. Left ventricular mild concentric hypertrophy. Normal left ventricular wall motion, no regional wall motion abnormality noted.  ?? There is no evidence of pulmonary hypertension.        Discharge Diagnoses:  -Chest pain-resolved, ACS thought to be unlikely  - Hypotension- improved                                        Patient Active Problem List   Diagnosis Code   ??? Hypotension I95.9   ??? Hypotension due to hypovolemia I95.89, E86.1   ??? CAD (coronary artery disease) I25.10   ??? Chest pain R07.9       Hospital Course by Problem     72 y o female presented to the ED w/ cc of mid sternal10/10 cp w/ radiations to her back, associated w/ worsening SOB. Pt woke up with symptoms which worsened at work, EMS was called, b/c her bp at work was 80/50. Pt w/ h/o labile bp's, pt reported h/o labile bp when coreg was increased and imdur was started. Pt reported taking 2 nitro'day due to angina that cardiologist is aware of. When she arrived at the ER bp was 87/49, with hr of 70, she eats strickt low Na diet. In the ED, her bp was 87/49, hr 70. EKG showed prolonged QTc, trop - X2, initial infectious w/u  was negative, CTA chest negative for PE. She remained w/ mild hypotension in the ED, CBL was placed in R IG and she was started on levophed and was transferred to the ICU.  After a brief stay in the ICU, she was tranferred out to tele monitored word. Her imdur and antihypertensives were held and to avoid recurrence of hypotension as w/u for other causes was negative. She cont'd to do well and soon after downgrade out of ICU, her orthostatic vital signs were within acceptable range. She will be dc'd home w/ instruction to not take her bp meds for at least a week and to f/u w/ her nephrologist before making final decisions on which med to take.            Today's examination of the patient revealed:     Subjective:     Objective:   VS:   Visit Vitals  BP 128/62 (BP 1 Location: Right arm, BP Patient Position: At rest)   Pulse 64   Temp 97.8 ??F (36.6 ??C)   Resp 20   Ht 5\' 5"  (1.651 m)   Wt 113.4 kg (250  lb 1.6 oz)   SpO2 99%   BMI 41.62 kg/m??      Tmax/24hrs: No data recorded.     Input/Output: No intake or output data in the 24 hours ending 11/02/17 2218    General:  Alert, awake, in nad  Cardiovascular:  Rrr, no murmurs  Pulmonary:  ctab  GI:  Soft, nt, nd  Extremities:  No edema  Additional:      Labs:    No results found for this or any previous visit (from the past 24 hour(s)).  Additional Data Reviewed:     Condition: stable  Disposition:    [x] Home   [] Home with Home Health   [] SNF/NH   [] Rehab   [] Home with family   [] Alternate Facility:____________________      Discharge Medications:   Cannot display discharge medications since this patient is not currently admitted.  Hold imdur, beta blocker, and other bp meds and calcium       Follow-up Appointments:   1. Your PCP: Elby Showers, MD, within 7-10days      >30 minutes spent coordinating this discharge (review instructions/follow-up, prescriptions, preparing report for sign off)    Signed:  Kathlen Mody, MD  11/02/2017  10:18 PM

## 2017-10-29 NOTE — Progress Notes (Signed)
Discharge:    Patient will discharge home today (10/29/2017). Patient has no home health orders in place for this discharge. Patient's family will transport home at the time of discharge. There are no other care manager needs for patient's discharge.     Rudean Haskell MSW  Care Manager  [phone]

## 2017-10-29 NOTE — Progress Notes (Signed)
Problem: Falls - Risk of  Goal: *Absence of Falls  Document Schmid Fall Risk and appropriate interventions in the flowsheet.  Outcome: Progressing Towards Goal  Fall Risk Interventions:            Medication Interventions: Patient to call before getting OOB    Elimination Interventions: Call light in reach

## 2017-10-29 NOTE — Progress Notes (Addendum)
6:16 AM    Patient Vitals for the past 8 hrs:   Temp Pulse Resp BP SpO2   10/29/17 0609 ??? ??? ??? (!) 87/54 ???   10/29/17 0608 ??? ??? ??? 110/78 ???   10/29/17 0607 ??? ??? ??? 121/71 ???   10/29/17 0452 97.9 ??F (36.6 ??C) 80 20 130/80 98 %   10/29/17 0020 97.2 ??F (36.2 ??C) 83 20 116/76 92 %       Pt experiencing pressure in chest radiating from the sternum to her back. Denies SOB and pain. Non diaphoretic. Complaints of being light headed.  BS 107.     EKG completed.     6:29 AM     MD notified.

## 2017-10-29 NOTE — Progress Notes (Signed)
Transfer to [facility]:    Patient was transferred to [facility]. This Probation officer reviewed previous Insurance underwriter. Patient has supportive family members. Patient's discharge plan is uncertain at this time. She has family who can transport her home at the time of discharge, if home is the plan. This Probation officer will continue to monitor for discharge planning to ensure a safe discharge from [facility].    Rudean Haskell MSW  Care Manager  [phone]

## 2017-10-29 NOTE — Progress Notes (Signed)
[facility]      CARDIOLOGY PROGRESS NOTE  RECS:  1. Chest pain-resolved normal troponin no EKG changes. No ACS- d/c heparin drip.  Continue aspirin/ BB/ brilanta and statin.  2. CAD with recent PCI to LAD and PTCA to Diagonal. On asa and Brilinta. - normal LV function.  3. Hypotension - requiring low dose vasopressor support-- BP now low normal c/o dizziness. Has orthostatic hypotension- started on low dose midodrine. increase po intake. Echo 11/18 showed preserved EF%   4. Dyslipidemia- continue statin   ??  Echo reviewed-- normal LV function with no WMA  Continue supportive care-  Continue midodrine and hold BB/ lasix for now.  Will f/u in clinic    ASSESSMENT:  Hospital Problems  Never Reviewed          Codes Class Noted POA    CAD (coronary artery disease) ICD-10-CM: I25.10  ICD-9-CM: 414.00  10/28/2017 Unknown        Chest pain ICD-10-CM: R07.9  ICD-9-CM: 786.50  10/28/2017 Unknown        Hypotension ICD-10-CM: I95.9  ICD-9-CM: 458.9  10/27/2017 Unknown        Hypotension due to hypovolemia ICD-10-CM: I95.89, E86.1  ICD-9-CM: 458.8, 276.52  10/27/2017 Unknown                SUBJECTIVE:  No CP  No SOB    OBJECTIVE:    VS:   Visit Vitals  BP 135/84 (BP 1 Location: Left arm, BP Patient Position: Sitting)   Pulse 75   Temp 98.2 ??F (36.8 ??C)   Resp 20   Ht 5\' 5"  (1.651 m)   Wt 113.4 kg (250 lb 1.6 oz)   SpO2 97%   BMI 41.62 kg/m??         Intake/Output Summary (Last 24 hours) at 10/29/2017 1123  Last data filed at 10/28/2017 1700  Gross per 24 hour   Intake 782.5 ml   Output 550 ml   Net 232.5 ml     TELE: normal sinus rhythm    General: alert, well developed, pleasant and in no apparent distress  HENT: Normocephalic, atraumatic.  Normal external eye.  Neck :  JVD difficult to assess due to obesity  Cardiac:  regular rate and rhythm, S1, S2 normal, no murmur, click, rub or gallop  Lungs: clear to auscultation bilaterally  Abdomen: Soft, nontender, no masses   Extremities:  No c/c/e, peripheral pulses present      Labs: Results:       Chemistry Recent Labs     10/29/17  0244 10/28/17  0400 10/27/17  2230 10/27/17  1824   GLU 96 86  --  75   NA 140 142  --  141   K 3.7 3.4*  --  4.0   CL 111* 112*  --  110*   CO2 24 22  --  21   BUN 12 18  --  23*   CREA 0.76 0.88  --  1.25   CA 8.5 7.9*  --  7.6*   AGAP 5 8  --  10   BUCR 16 20  --  18   AP  --   --  73  --    TP  --   --  6.1*  --    ALB  --   --  3.4  --    GLOB  --   --  2.7  --    AGRAT  --   --  1.3  --       CBC w/Diff Recent Labs     10/29/17  0244 10/28/17  0400 10/27/17  1824   WBC 5.4 5.8 6.4   RBC 3.89* 3.62* 3.76*   HGB 12.0 11.0* 11.5*   HCT 35.8 33.6* 34.8*   PLT 176 189 190   GRANS 68 66 73   LYMPH 21 23 18*   EOS 2 2 1       Cardiac Enzymes No results for input(s): CPK, CKND1, MYO in the last 72 hours.    No lab exists for component: CKRMB, TROIP   Coagulation Recent Labs     10/28/17  0400 10/27/17  2230   APTT 86.5* 35.3       Lipid Panel No results found for: CHOL, CHOLPOCT, CHOLX, CHLST, CHOLV, 884269, HDL, LDL, LDLC, DLDLP, 147829, VLDLC, VLDL, TGLX, TRIGL, TRIGP, TGLPOCT, CHHD, CHHDX   BNP No results for input(s): BNPP in the last 72 hours.   Liver Enzymes Recent Labs     10/27/17  2230   TP 6.1*   ALB 3.4   AP 73   SGOT 12*      Thyroid Studies Lab Results   Component Value Date/Time    TSH 18.30 (H) 10/27/2017 10:30 PM              Fredirick Maudlin MD

## 2017-11-02 LAB — CULTURE, BLOOD: Culture result:: NO GROWTH

## 2017-11-03 LAB — CULTURE, BLOOD: Culture result:: NO GROWTH

## 2017-11-12 ENCOUNTER — Encounter: Attending: Cardiovascular Disease | Primary: Internal Medicine

## 2017-11-12 ENCOUNTER — Ambulatory Visit
Admit: 2017-11-12 | Discharge: 2017-11-12 | Payer: PRIVATE HEALTH INSURANCE | Attending: Cardiovascular Disease | Primary: Internal Medicine

## 2017-11-12 DIAGNOSIS — I25118 Atherosclerotic heart disease of native coronary artery with other forms of angina pectoris: Secondary | ICD-10-CM

## 2017-11-12 MED ORDER — MIDODRINE 5 MG TAB
5 mg | ORAL_TABLET | Freq: Three times a day (TID) | ORAL | 0 refills | Status: AC
Start: 2017-11-12 — End: 2017-12-12

## 2017-11-12 NOTE — Progress Notes (Signed)
HISTORY OF PRESENT ILLNESS  [name] [name] is a 62 y.o. female.  Hospital Follow Up   The history is provided by the patient. This is a chronic problem. The problem occurs every several days. Associated symptoms include shortness of breath. Pertinent negatives include no chest pain, no abdominal pain and no headaches.       Review of Systems   Constitutional: Negative for chills, diaphoresis, fever and weight loss.   HENT: Negative for ear pain and hearing loss.    Eyes: Negative for blurred vision.   Respiratory: Positive for shortness of breath. Negative for cough, hemoptysis, sputum production, wheezing and stridor.    Cardiovascular: Negative for chest pain, palpitations, orthopnea, claudication, leg swelling and PND.   Gastrointestinal: Negative for abdominal pain, heartburn, nausea and vomiting.   Musculoskeletal: Negative for myalgias and neck pain.   Skin: Negative for rash.   Neurological: Negative for dizziness, tingling, tremors, focal weakness, loss of consciousness, weakness and headaches.   Psychiatric/Behavioral: Negative for depression and suicidal ideas.     Family History   Problem Relation Age of Onset   ??? No Known Problems Mother    ??? No Known Problems Father        Past Medical History:   Diagnosis Date   ??? Essential hypertension    ??? Hyperlipidemia        Past Surgical History:   Procedure Laterality Date   ??? HX CORONARY STENT PLACEMENT         Social History     Tobacco Use   ??? Smoking status: Never Smoker   ??? Smokeless tobacco: Never Used   Substance Use Topics   ??? Alcohol use: Yes     Frequency: 2-4 times a month     Drinks per session: 1 or 2     Binge frequency: Never       Allergies   Allergen Reactions   ??? Morphine Hives and Itching   ??? Penicillins Rash   ??? Compazine [Prochlorperazine] Other (comments)     Jerking movements       Outpatient Medications Marked as Taking for the 11/12/17 encounter (Office Visit) with Fredirick Maudlin, MD   Medication Sig Dispense Refill    ??? methscopolamine (PAMINE) 2.5 mg tab tablet Take  by mouth. Take 6 tabs by mouth every 7 days.     ??? thyroid, Pork, (ARMOUR) 90 mg tablet Take  by mouth daily.     ??? ergocalciferol (VITAMIN D2) 50,000 unit capsule Take 50,000 Units by mouth.     ??? traMADol (ULTRAM) 50 mg tablet Take 50 mg by mouth every six (6) hours as needed for Pain.     ??? hydroxychloroquine (PLAQUENIL) 200 mg tablet Take 200 mg by mouth two (2) times a day.     ??? folic acid (FOLVITE) 1 mg tablet Take  by mouth daily.     ??? esomeprazole (NEXIUM) 40 mg capsule Take  by mouth daily.     ??? ticagrelor (BRILINTA) 90 mg tablet Take  by mouth two (2) times a day.     ??? simvastatin (ZOCOR) 20 mg tablet Take  by mouth nightly.     ??? oxyCODONE-acetaminophen (PERCOCET) 5-325 mg per tablet Take  by mouth every four (4) hours as needed for Pain.     ??? midodrine (PROAMITINE) 5 mg tablet Take 1 Tab by mouth three (3) times daily (with meals) for 30 days. 90 Tab 0   ??? pramipexole (MIRAPEX) 1 mg tablet  Take 1 Tab by mouth nightly. 15 Tab 0        Visit Vitals  BP 115/71   Pulse 71   Ht 5\' 5"  (1.651 m)   Wt 113.4 kg (250 lb)   BMI 41.60 kg/m??     Physical Exam   Constitutional: She is oriented to person, place, and time. She appears well-developed and well-nourished. No distress.   HENT:   Head: Atraumatic.   Mouth/Throat: No oropharyngeal exudate.   Eyes: Conjunctivae are normal. No scleral icterus.   Neck: Neck supple. No JVD present.   Cardiovascular: Normal rate and regular rhythm. Exam reveals no gallop.   No murmur heard.  Pulmonary/Chest: Effort normal and breath sounds normal. No stridor. She has no wheezes. She has no rales.   Abdominal: Soft. There is no tenderness. There is no rebound.   Musculoskeletal: Normal range of motion. She exhibits no edema or tenderness.   Neurological: She is alert and oriented to person, place, and time. She exhibits normal muscle tone.   Skin: Skin is warm. She is not diaphoretic.    Psychiatric: She has a normal mood and affect. Her behavior is normal.     10/27/17   ECHO ADULT FOLLOW-UP OR LIMITED 10/28/2017 10/28/2017    Narrative ?? Estimated left ventricular ejection fraction is 61 - 65%. Biplane method   used to measure ejection fraction. Left ventricular mild concentric   hypertrophy. Normal left ventricular wall motion, no regional wall motion   abnormality noted.  ?? There is no evidence of pulmonary hypertension.        Signed by: Fredirick Maudlin, MD     ASSESSMENT and PLAN    ICD-10-CM ICD-9-CM    1. Coronary artery disease of native artery of native heart with stable angina pectoris ([place]) I25.118 414.01      413.9    2. Dyslipidemia, goal LDL below 70 E78.5 272.4 LIPID PANEL      HEPATIC FUNCTION PANEL   3. Orthostatic hypotension I95.1 458.0    4. History of placement of stent in LAD coronary artery Z95.5 V45.82      Orders Placed This Encounter   ??? LIPID PANEL     Standing Status:   Future     Standing Expiration Date:   11/13/2018   ??? HEPATIC FUNCTION PANEL     Standing Status:   Future     Standing Expiration Date:   11/13/2018   ??? midodrine (PROAMITINE) 5 mg tablet     Sig: Take 1 Tab by mouth three (3) times daily (with meals) for 30 days.     Dispense:  90 Tab     Refill:  0     Follow-up Disposition:  Return in about 4 months (around 03/12/2018).  reviewed diet, exercise and weight control  cardiovascular risk and specific lipid/LDL goals reviewed  use of aspirin to prevent MI and TIA's discussed.    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  She was recently hospitalized to [facility] with hypotension likely from medications.  Subsequently she was hydrated and continued to have orthostatic hypotension.  She was started on Midodrine 5 mg twice daily and her symptoms have slowly improved .  We will increase midodrine to 5 mg 3 times daily and if needed to 10 mg 3 times daily.   Follow-up in 4 months with lipid and liver panel.

## 2017-12-10 ENCOUNTER — Encounter: Attending: Cardiovascular Disease | Primary: Internal Medicine

## 2017-12-18 ENCOUNTER — Encounter: Admit: 2017-12-18 | Discharge: 2017-12-24 | Payer: PRIVATE HEALTH INSURANCE | Primary: Internal Medicine

## 2017-12-18 NOTE — Telephone Encounter (Signed)
Patients BP today is 129/76 and HR 69.  Patient states she d/c taking Midodrine approx 8-10 days ago

## 2018-03-18 ENCOUNTER — Encounter: Attending: Cardiovascular Disease | Primary: Internal Medicine

## 2019-02-21 ENCOUNTER — Emergency Department: Admit: 2019-02-22 | Payer: BLUE CROSS/BLUE SHIELD | Primary: Internal Medicine

## 2019-02-21 DIAGNOSIS — R509 Fever, unspecified: Secondary | ICD-10-CM

## 2019-02-21 NOTE — ED Notes (Signed)
Unable to obtain IV access.  Will continue to obtain.

## 2019-02-21 NOTE — Progress Notes (Signed)
Reason for Renal Dosing:  Per Renal Dosing Policy    Patient clinical status and labs ordered/reviewed.    Pt Weight Weight: 96.2 kg (212 lb)   Serum Creatinine Lab Results   Component Value Date/Time    Creatinine 0.76 10/29/2017 02:44 AM       Creatinine Clearance CrCl cannot be calculated (Patient's most recent lab result is older than the maximum 180 days allowed.).   BUN Lab Results   Component Value Date/Time    BUN 12 10/29/2017 02:44 AM       WBC Lab Results   Component Value Date/Time    WBC 5.4 10/29/2017 02:44 AM      Temperature Temp: 100.2 F (37.9 C)   HR Pulse (Heart Rate): (!) 116     BP BP: 122/81           Drug type: Antibiotic indicated for sepsis      Drug/dose: Zosyn 3.375 g IV q6 hours    Continue to monitor.    Signed Kandee Keen, PHARMD  Date 02/21/2019  Time 10:06 PM

## 2019-02-21 NOTE — Progress Notes (Signed)
Kinetic Dosing- Initial Progress Note    Pharmacy Consult ordered by Dr. Charleston Ropes, [place]      Indication: sepsis    Patient clinical status and labs ordered/reviewed.     Dose: Vancomycin 2000 mg IV x1 dose then pharmacy will continue to dose.    Continue to monitor    Sign: Kandee Keen, [place]  Date: 02/21/2019  Time: 10:13 PM

## 2019-02-21 NOTE — ED Notes (Signed)
Patient presents to the ED for evaluation of fever and sore throat. Patient states, "I have been feeling like crap with a sore throat for 2-3 days. I started getting a fever yesterday with a temperature around 100.4-100.8. I took medicine and my fever went away around 9 this morning but around 5pm tonight it came back. 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

## 2019-02-21 NOTE — ED Triage Notes (Signed)
Patient presents to the ED for evaluation of fever and sore throat. Patient states, "I have been feeling like crap with a sore throat for 2-3 days. I started getting a fever yesterday with a temperature around 100.4-100.8. I took medicine and my fever went away around 9 this morning but around 5pm tonight it came back. 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

## 2019-02-21 NOTE — Progress Notes (Signed)
Reason for Renal Dosing:  Per Renal Dosing Policy    Patient clinical status and labs ordered/reviewed.    Pt Weight Weight: 96.2 kg (212 lb)   Serum Creatinine Lab Results   Component Value Date/Time    Creatinine 0.76 10/29/2017 02:44 AM       Creatinine Clearance CrCl cannot be calculated (Patient's most recent lab result is older than the maximum 180 days allowed.).   BUN Lab Results   Component Value Date/Time    BUN 12 10/29/2017 02:44 AM       WBC Lab Results   Component Value Date/Time    WBC 5.4 10/29/2017 02:44 AM      Temperature Temp: 100.2 ??F (37.9 ??C)   HR Pulse (Heart Rate): (!) 116     BP BP: 122/81           Drug type: Antibiotic indicated for sepsis      Drug/dose: Zosyn 3.375 g IV q6 hours    Continue to monitor.    Signed Kandee Keen, PHARMD  Date 02/21/2019  Time 10:06 PM

## 2019-02-22 ENCOUNTER — Emergency Department: Admit: 2019-02-22 | Payer: BLUE CROSS/BLUE SHIELD | Primary: Internal Medicine

## 2019-02-22 ENCOUNTER — Inpatient Hospital Stay
Admit: 2019-02-22 | Discharge: 2019-02-22 | Disposition: A | Discharge status: Home / Self Care | Payer: BLUE CROSS/BLUE SHIELD | Attending: Emergency Medicine

## 2019-02-22 LAB — METABOLIC PANEL, COMPREHENSIVE
A-G Ratio: 0.9 (ref 0.8–1.7)
ALT (SGPT): 134 U/L — ABNORMAL HIGH (ref 13–56)
AST (SGOT): 140 U/L — ABNORMAL HIGH (ref 10–38)
Albumin: 3.1 g/dL — ABNORMAL LOW (ref 3.4–5.0)
Alk. phosphatase: 307 U/L — ABNORMAL HIGH (ref 45–117)
Anion gap: 4 mmol/L (ref 3.0–18)
BUN/Creatinine ratio: 14 (ref 12–20)
BUN: 13 MG/DL (ref 7.0–18)
Bilirubin, total: 1.3 MG/DL — ABNORMAL HIGH (ref 0.2–1.0)
CO2: 27 mmol/L (ref 21–32)
Calcium: 8 MG/DL — ABNORMAL LOW (ref 8.5–10.1)
Chloride: 104 mmol/L (ref 100–111)
Creatinine: 0.95 MG/DL (ref 0.6–1.3)
GFR est AA: >60 mL/min/{1.73_m2} (ref >60)
GFR est non-AA: 60 mL/min/{1.73_m2} — ABNORMAL LOW (ref >60)
Globulin: 3.5 g/dL (ref 2.0–4.0)
Glucose: 101 mg/dL — ABNORMAL HIGH (ref 74–99)
Potassium: 3.9 mmol/L (ref 3.5–5.5)
Protein, total: 6.6 g/dL (ref 6.4–8.2)
Sodium: 135 mmol/L — ABNORMAL LOW (ref 136–145)

## 2019-02-22 LAB — URINALYSIS W/ RFLX MICROSCOPIC
Blood, Urine: NEGATIVE
Blood: NEGATIVE
Glucose, Ur: NEGATIVE mg/dL
Glucose: NEGATIVE mg/dL
Nitrite, Urine: NEGATIVE
Nitrites: NEGATIVE
Specific Gravity, UA: 1.022 (ref 1.005–1.030)
Specific gravity: 1.022 (ref 1.005–1.030)
Urobilinogen, UA, POCT: 1 EU/dL (ref 0.2–1.0)
Urobilinogen: 1 EU/dL (ref 0.2–1.0)
pH (UA): 5.5 (ref 5.0–8.0)
pH, UA: 5.5 (ref 5.0–8.0)

## 2019-02-22 LAB — CBC WITH AUTOMATED DIFF
ABS. BASOPHILS: 0 10*3/uL (ref 0.0–0.1)
ABS. EOSINOPHILS: 0 10*3/uL (ref 0.0–0.4)
ABS. LYMPHOCYTES: 0.8 10*3/uL — ABNORMAL LOW (ref 0.9–3.6)
ABS. MONOCYTES: 0.8 10*3/uL (ref 0.05–1.2)
ABS. NEUTROPHILS: 3.1 10*3/uL (ref 1.8–8.0)
BASOPHILS: 0 % (ref 0–2)
EOSINOPHILS: 0 % (ref 0–5)
HCT: 37.2 % (ref 35.0–45.0)
HGB: 12.9 g/dL (ref 12.0–16.0)
LYMPHOCYTES: 16 % — ABNORMAL LOW (ref 21–52)
MCH: 29.8 PG (ref 24.0–34.0)
MCHC: 34.7 g/dL (ref 31.0–37.0)
MCV: 85.9 FL (ref 74.0–97.0)
MONOCYTES: 16 % — ABNORMAL HIGH (ref 3–10)
MPV: 10.9 FL (ref 9.2–11.8)
NEUTROPHILS: 68 % (ref 40–73)
PLATELET COMMENTS: DECREASED
PLATELET: 84 10*3/uL — ABNORMAL LOW (ref 135–420)
RBC: 4.33 M/uL (ref 4.20–5.30)
RDW: 17.9 % — ABNORMAL HIGH (ref 11.6–14.5)
WBC: 4.7 10*3/uL (ref 4.6–13.2)

## 2019-02-22 LAB — URINE MICROSCOPIC ONLY
BACTERIA, URINE: NEGATIVE /hpf
Bacteria: NEGATIVE /hpf
RBC, UA: 0 /hpf (ref 0–5)
RBC: 0 /hpf (ref 0–5)
WBC, UA: 0 /hpf (ref 0–4)
WBC: 0 /hpf (ref 0–4)

## 2019-02-22 LAB — PROTHROMBIN TIME + INR
INR: 1.2 (ref 0.8–1.2)
Prothrombin time: 15.1 s (ref 11.5–15.2)

## 2019-02-22 LAB — C REACTIVE PROTEIN, QT: C-Reactive protein: 7.5 mg/dL — ABNORMAL HIGH (ref 0–0.3)

## 2019-02-22 LAB — PROCALCITONIN
Procalcitonin: 0.5 ng/mL
Procalcitonin: 0.5 ng/mL

## 2019-02-22 LAB — POC LACTIC ACID: Lactic Acid (POC): 1.34 mmol/L (ref 0.40–2.00)

## 2019-02-22 LAB — LD: LD: 450 U/L — ABNORMAL HIGH (ref 81–234)

## 2019-02-22 LAB — TROPONIN I: Troponin-I, QT: <0.02 NG/ML (ref 0.0–0.045)

## 2019-02-22 LAB — PTT: aPTT: 29.5 s (ref 23.0–36.4)

## 2019-02-22 LAB — FERRITIN
Ferritin: 548 NG/ML — ABNORMAL HIGH (ref 8–388)
Ferritin: 548 NG/ML — ABNORMAL HIGH (ref 8–388)

## 2019-02-22 LAB — CBC WITH AUTO DIFFERENTIAL
Basophils %: 0 % (ref 0–2)
Basophils Absolute: 0 10*3/uL (ref 0.0–0.1)
Eosinophils %: 0 % (ref 0–5)
Eosinophils Absolute: 0 10*3/uL (ref 0.0–0.4)
Hematocrit: 37.2 % (ref 35.0–45.0)
Hemoglobin: 12.9 g/dL (ref 12.0–16.0)
Lymphocytes %: 16 % — ABNORMAL LOW (ref 21–52)
Lymphocytes Absolute: 0.8 10*3/uL — ABNORMAL LOW (ref 0.9–3.6)
MCH: 29.8 PG (ref 24.0–34.0)
MCHC: 34.7 g/dL (ref 31.0–37.0)
MCV: 85.9 FL (ref 74.0–97.0)
MPV: 10.9 FL (ref 9.2–11.8)
Monocytes %: 16 % — ABNORMAL HIGH (ref 3–10)
Monocytes Absolute: 0.8 10*3/uL (ref 0.05–1.2)
Neutrophils %: 68 % (ref 40–73)
Neutrophils Absolute: 3.1 10*3/uL (ref 1.8–8.0)
Platelet Comment: DECREASED
Platelets: 84 10*3/uL — ABNORMAL LOW (ref 135–420)
RBC: 4.33 M/uL (ref 4.20–5.30)
RDW: 17.9 % — ABNORMAL HIGH (ref 11.6–14.5)
WBC: 4.7 10*3/uL (ref 4.6–13.2)

## 2019-02-22 LAB — COMPREHENSIVE METABOLIC PANEL
ALT: 134 U/L — ABNORMAL HIGH (ref 13–56)
AST: 140 U/L — ABNORMAL HIGH (ref 10–38)
Albumin/Globulin Ratio: 0.9 (ref 0.8–1.7)
Albumin: 3.1 g/dL — ABNORMAL LOW (ref 3.4–5.0)
Alkaline Phosphatase: 307 U/L — ABNORMAL HIGH (ref 45–117)
Anion Gap: 4 mmol/L (ref 3.0–18)
BUN: 13 MG/DL (ref 7.0–18)
Bun/Cre Ratio: 14 (ref 12–20)
CO2: 27 mmol/L (ref 21–32)
Calcium: 8 MG/DL — ABNORMAL LOW (ref 8.5–10.1)
Chloride: 104 mmol/L (ref 100–111)
Creatinine: 0.95 MG/DL (ref 0.6–1.3)
EGFR IF NonAfrican American: 60 mL/min/{1.73_m2} — ABNORMAL LOW (ref >60)
GFR African American: >60 mL/min/{1.73_m2} (ref >60)
Globulin: 3.5 g/dL (ref 2.0–4.0)
Glucose: 101 mg/dL — ABNORMAL HIGH (ref 74–99)
Potassium: 3.9 mmol/L (ref 3.5–5.5)
Sodium: 135 mmol/L — ABNORMAL LOW (ref 136–145)
Total Bilirubin: 1.3 MG/DL — ABNORMAL HIGH (ref 0.2–1.0)
Total Protein: 6.6 g/dL (ref 6.4–8.2)

## 2019-02-22 LAB — LACTATE DEHYDROGENASE: LD: 450 U/L — ABNORMAL HIGH (ref 81–234)

## 2019-02-22 LAB — C-REACTIVE PROTEIN: CRP: 7.5 mg/dL — ABNORMAL HIGH (ref 0–0.3)

## 2019-02-22 LAB — PROTIME-INR
INR: 1.2 (ref 0.8–1.2)
Protime: 15.1 s (ref 11.5–15.2)

## 2019-02-22 LAB — POCT LACTIC ACID: POC Lactic Acid: 1.34 mmol/L (ref 0.40–2.00)

## 2019-02-22 LAB — APTT: aPTT: 29.5 s (ref 23.0–36.4)

## 2019-02-22 LAB — TROPONIN: Troponin I: <0.02 NG/ML (ref 0.0–0.045)

## 2019-02-22 MED ORDER — SODIUM CHLORIDE 0.9 % IV PIGGY BACK
3.375 gram | Freq: Once | INTRAVENOUS | Status: AC
Start: 2019-02-22 — End: 2019-02-22
  Administered 2019-02-22: 04:00:00 via INTRAVENOUS

## 2019-02-22 MED ORDER — VANCOMYCIN IN 0.9 % SODIUM CHLORIDE 1.25 GRAM/250 ML IV
1.25 gram/250 mL | Freq: Two times a day (BID) | INTRAVENOUS | Status: DC
Start: 2019-02-22 — End: 2019-02-22

## 2019-02-22 MED ORDER — PHARMACY VANCOMYCIN NOTE
Status: DC
Start: 2019-02-22 — End: 2019-02-22

## 2019-02-22 MED ORDER — PIPERACILLIN-TAZOBACTAM 3.375 GRAM IV SOLR
3.375 gram | Freq: Four times a day (QID) | INTRAVENOUS | Status: DC
Start: 2019-02-22 — End: 2019-02-22

## 2019-02-22 MED ORDER — ACETAMINOPHEN 325 MG TABLET
325 mg | Freq: Once | ORAL | Status: AC
Start: 2019-02-22 — End: 2019-02-22
  Administered 2019-02-22: 04:00:00 via ORAL

## 2019-02-22 MED ORDER — SODIUM CHLORIDE 0.9% BOLUS IV
0.9 % | Freq: Once | INTRAVENOUS | Status: AC
Start: 2019-02-22 — End: 2019-02-22
  Administered 2019-02-22: 04:00:00 via INTRAVENOUS

## 2019-02-22 MED ORDER — SODIUM CHLORIDE 0.9 % IJ SYRG
INTRAMUSCULAR | Status: DC | PRN
Start: 2019-02-22 — End: 2019-02-22

## 2019-02-22 MED ORDER — VANCOMYCIN IN 0.9 % SODIUM CHLORIDE 2 GRAM/500 ML IV
2 gram/500 mL | Freq: Once | INTRAVENOUS | Status: AC
Start: 2019-02-22 — End: 2019-02-22
  Administered 2019-02-22: 05:00:00 via INTRAVENOUS

## 2019-02-22 MED FILL — PHARMACY VANCOMYCIN NOTE: Qty: 1

## 2019-02-22 MED FILL — SODIUM CHLORIDE 0.9 % IV: INTRAVENOUS | Qty: 1000

## 2019-02-22 MED FILL — PIPERACILLIN-TAZOBACTAM 3.375 GRAM IV SOLR: 3.375 gram | INTRAVENOUS | Qty: 3.38

## 2019-02-22 MED FILL — BD POSIFLUSH NORMAL SALINE 0.9 % INJECTION SYRINGE: INTRAMUSCULAR | Qty: 10

## 2019-02-22 MED FILL — VANCOMYCIN IN 0.9 % SODIUM CHLORIDE 2 GRAM/500 ML IV: 2 gram/500 mL | INTRAVENOUS | Qty: 500

## 2019-02-22 MED FILL — MAPAP (ACETAMINOPHEN) 325 MG TABLET: 325 mg | ORAL | Qty: 2

## 2019-02-22 NOTE — Progress Notes (Signed)
Called all 3 listed numbers without answer.  Left voicemail message on mobile phone number call.  No option to leave voicemail on other numbers.  Patient just needs notification of negative COVID test result.

## 2019-02-22 NOTE — ED Notes (Signed)
I have reviewed discharge instructions with the patient.  The patient verbalized understanding. Patient armband removed and given to patient to take home.  Patient was informed of the privacy risks if armband lost or stolen.

## 2019-02-22 NOTE — ED Provider Notes (Signed)
EMERGENCY DEPARTMENT HISTORY AND PHYSICAL EXAM    12:03 AM  Date: 02/21/2019  Patient Name: [name] [name]    History of Presenting Illness     Chief Complaint   Patient presents with   . Fever        History Provided By: Patient    HPI: [name] [name] is a 63 y.o. female with history of hypertension.  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  Denies chest pain, shortness of breath or cough.  Denies vomiting but is reporting diarrhea, no abdominal pain.  No urinary symptoms.  Patient does not have known sick contacts or exposure to COVID-19 patients.    Location:  Severity:  Timing/course:   Onset/Duration:     PCP: Elby Showers, MD    Past History     Past Medical History:  Past Medical History:   Diagnosis Date   . Essential hypertension    . Hyperlipidemia        Past Surgical History:  Past Surgical History:   Procedure Laterality Date   . HX CORONARY STENT PLACEMENT         Family History:  Family History   Problem Relation Age of Onset   . No Known Problems Mother    . No Known Problems Father        Social History:  Social History     Tobacco Use   . Smoking status: Never Smoker   . Smokeless tobacco: Never Used   Substance Use Topics   . Alcohol use: Yes     Frequency: 2-4 times a month     Drinks per session: 1 or 2     Binge frequency: Never   . Drug use: No       Allergies:  Allergies   Allergen Reactions   . Morphine Hives and Itching   . Penicillins Rash   . Compazine [Prochlorperazine] Other (comments)     Jerking movements       Review of Systems   Review of Systems   Constitutional: Positive for fatigue and fever.   All other systems reviewed and are negative.       Physical Exam     Patient Vitals for the past 12 hrs:   Temp Pulse Resp BP SpO2   02/21/19 2130 100.2 F (37.9 C) (!) 116 26 122/81 96 %       Physical Exam  Vitals signs and nursing note reviewed.   Constitutional:       Appearance: Normal appearance.   HENT:      Head:  Normocephalic and atraumatic.   Eyes:      Extraocular Movements: Extraocular movements intact.   Neck:      Musculoskeletal: Normal range of motion and neck supple.   Cardiovascular:      Rate and Rhythm: Tachycardia present.   Pulmonary:      Effort: Pulmonary effort is normal. No respiratory distress.   Abdominal:      General: There is no distension.      Palpations: Abdomen is soft.      Tenderness: There is no abdominal tenderness.   Musculoskeletal: Normal range of motion.         General: No deformity.   Skin:     General: Skin is warm and dry.   Neurological:      General: No focal deficit present.  Mental Status: She is alert and oriented to person, place, and time.   Psychiatric:         Mood and Affect: Mood normal.         Behavior: Behavior normal.         Diagnostic Study Results     Labs -  Recent Results (from the past 12 hour(s))   METABOLIC PANEL, COMPREHENSIVE    Collection Time: 02/21/19 10:31 PM   Result Value Ref Range    Sodium 135 (L) 136 - 145 mmol/L    Potassium 3.9 3.5 - 5.5 mmol/L    Chloride 104 100 - 111 mmol/L    CO2 27 21 - 32 mmol/L    Anion gap 4 3.0 - 18 mmol/L    Glucose 101 (H) 74 - 99 mg/dL    BUN 13 7.0 - 18 MG/DL    Creatinine 0.95 0.6 - 1.3 MG/DL    BUN/Creatinine ratio 14 12 - 20      GFR est AA >60 >60 ml/min/1.4m    GFR est non-AA 60 (L) >60 ml/min/1.733m   Calcium 8.0 (L) 8.5 - 10.1 MG/DL    Bilirubin, total 1.3 (H) 0.2 - 1.0 MG/DL    ALT (SGPT) 134 (H) 13 - 56 U/L    AST (SGOT) 140 (H) 10 - 38 U/L    Alk. phosphatase 307 (H) 45 - 117 U/L    Protein, total 6.6 6.4 - 8.2 g/dL    Albumin 3.1 (L) 3.4 - 5.0 g/dL    Globulin 3.5 2.0 - 4.0 g/dL    A-G Ratio 0.9 0.8 - 1.7     CBC WITH AUTOMATED DIFF    Collection Time: 02/21/19 10:31 PM   Result Value Ref Range    WBC 4.7 4.6 - 13.2 K/uL    RBC 4.33 4.20 - 5.30 M/uL    HGB 12.9 12.0 - 16.0 g/dL    HCT 37.2 35.0 - 45.0 %    MCV 85.9 74.0 - 97.0 FL    MCH 29.8 24.0 - 34.0 PG    MCHC 34.7 31.0 - 37.0 g/dL    RDW 17.9 (H)  11.6 - 14.5 %    PLATELET 84 (L) 135 - 420 K/uL    MPV 10.9 9.2 - 11.8 FL    NEUTROPHILS 68 40 - 73 %    LYMPHOCYTES 16 (L) 21 - 52 %    MONOCYTES 16 (H) 3 - 10 %    EOSINOPHILS 0 0 - 5 %    BASOPHILS 0 0 - 2 %    ABS. NEUTROPHILS 3.1 1.8 - 8.0 K/UL    ABS. LYMPHOCYTES 0.8 (L) 0.9 - 3.6 K/UL    ABS. MONOCYTES 0.8 0.05 - 1.2 K/UL    ABS. EOSINOPHILS 0.0 0.0 - 0.4 K/UL    ABS. BASOPHILS 0.0 0.0 - 0.1 K/UL    DF AUTOMATED      PLATELET COMMENTS DECREASED PLATELETS      RBC COMMENTS ANISOCYTOSIS  1+        RBC COMMENTS POLYCHROMASIA  1+        RBC COMMENTS STOMATOCYTES  1+       PROCALCITONIN    Collection Time: 02/21/19 10:31 PM   Result Value Ref Range    Procalcitonin 0.50 ng/mL   C REACTIVE PROTEIN, QT    Collection Time: 02/21/19 10:31 PM   Result Value Ref Range    C-Reactive protein 7.5 (H) 0 - 0.3 mg/dL  LD    Collection Time: 02/21/19 10:31 PM   Result Value Ref Range    LD 450 (H) 81 - 234 U/L   FERRITIN    Collection Time: 02/21/19 10:31 PM   Result Value Ref Range    Ferritin 548 (H) 8 - 388 NG/ML   TROPONIN I    Collection Time: 02/21/19 10:31 PM   Result Value Ref Range    Troponin-I, QT <0.02 0.0 - 0.045 NG/ML   PROTHROMBIN TIME + INR    Collection Time: 02/21/19 11:18 PM   Result Value Ref Range    Prothrombin time 15.1 11.5 - 15.2 sec    INR 1.2 0.8 - 1.2     PTT    Collection Time: 02/21/19 11:18 PM   Result Value Ref Range    aPTT 29.5 23.0 - 36.4 SEC   POC LACTIC ACID    Collection Time: 02/21/19 11:22 PM   Result Value Ref Range    Lactic Acid (POC) 1.34 0.40 - 2.00 mmol/L       Radiologic Studies -   Xr Chest Port    Result Date: 02/21/2019  IMPRESSION: No acute findings.        Medical Decision Making     ED Course: Progress Notes, Reevaluation, and Consults:    12:03 AM Initial assessment performed. The patients presenting problems have been discussed, and they/their family are in agreement with the care plan formulated and outlined with them.  I have encouraged them to ask questions as they  arise throughout their visit.        Provider Notes (Medical Decision Making): 63 year old female with history of hypertension presenting with generalized fatigue and fever.  Triggered sepsis protocol and broad-spectrum antibiotics were also initiated.  She is hemodynamically stable so we will provide fluids as needed.  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    Procedures:     Critical Care Time:     Vital Signs-Reviewed the patient's vital signs. Reviewed pt's pulse ox reading.     EKG:  Interpreted by the EP.   Time Interpreted:    Rate:    Rhythm:    Interpretation:   Comparison:     Records Reviewed: Nursing Notes (Time of Review: 12:03 AM)  -I am the first provider for this patient.  -I reviewed the vital signs, available nursing notes, past medical history, past surgical history, family history and social history.    Current Facility-Administered Medications   Medication Dose Route Frequency Provider Last Rate Last Dose   . sodium chloride (NS) flush 5-10 mL  5-10 mL IntraVENous PRN Seichi Kaufhold A, MD       . piperacillin-tazobactam (ZOSYN) 3.375 g in 0.9% sodium chloride (MBP/ADV) 100 mL MBP  3.375 g IntraVENous ONCE Colby Reels A, MD 200 mL/hr at 02/22/19 0001 3.375 g at 02/22/19 0001    Followed by   . piperacillin-tazobactam (ZOSYN) 3.375 g in 0.9% sodium chloride (MBP/ADV) 100 mL MBP  3.375 g IntraVENous Q6H Jorian Willhoite A, MD       . vancomycin (VANCOCIN) 2000 mg in NS 500 ml infusion  2,000 mg IntraVENous ONCE Jarielys Girardot A, MD       . VANCOMYCIN: Pharmacy to dose   Other Rx Dosing/Monitoring Suanne Minahan A, MD         Current Outpatient Medications   Medication Sig Dispense Refill   . methscopolamine (PAMINE)  2.5 mg tab tablet Take  by mouth. Take 6 tabs by mouth every 7 days.     Marland Kitchen thyroid, Pork, (ARMOUR) 90 mg tablet Take  by mouth daily.     . ergocalciferol (VITAMIN D2) 50,000 unit capsule Take 50,000 Units by mouth.     . traMADol  (ULTRAM) 50 mg tablet Take 50 mg by mouth every six (6) hours as needed for Pain.     . hydroxychloroquine (PLAQUENIL) 200 mg tablet Take 200 mg by mouth two (2) times a day.     . folic acid (FOLVITE) 1 mg tablet Take  by mouth daily.     Marland Kitchen esomeprazole (NEXIUM) 40 mg capsule Take  by mouth daily.     . ticagrelor (BRILINTA) 90 mg tablet Take  by mouth two (2) times a day.     . simvastatin (ZOCOR) 20 mg tablet Take  by mouth nightly.     Marland Kitchen oxyCODONE-acetaminophen (PERCOCET) 5-325 mg per tablet Take  by mouth every four (4) hours as needed for Pain.     . pramipexole (MIRAPEX) 1 mg tablet Take 1 Tab by mouth nightly. 15 Tab 0        Clinical Impression     Clinical Impression: No diagnosis found.    Disposition: dc      DISCHARGE NOTE:     Pt has been reexamined.  Patient has no new complaints, changes, or physical findings.  Care plan outlined and precautions discussed.  Results of labs and imaging were reviewed with the patient. All medications were reviewed with the patient; will d/c home with return precautions. All of pt's questions and concerns were addressed. Patient was instructed and agrees to follow up with PCP, as well as to return to the ED upon further deterioration. Patient is ready to go home.    This note was dictated utilizing voice recognition software which may lead to typographical errors.  I apologize in advance if the situation occurs.  If questions arise please do not hesitate to contact me or call our department.    Dannis Deroche A Charleston Ropes, MD  12:03 AM

## 2019-02-22 NOTE — ED Provider Notes (Addendum)
EMERGENCY DEPARTMENT HISTORY AND PHYSICAL EXAM    12:03 AM  Date: 02/21/2019  Patient Name: [name] [name]    History of Presenting Illness     Chief Complaint   Patient presents with   ??? Fever        History Provided By: Patient    HPI: [name] [name] is a 63 y.o. female with history of hypertension.  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  Denies chest pain, shortness of breath or cough.  Denies vomiting but is reporting diarrhea, no abdominal pain.  No urinary symptoms.  Patient does not have known sick contacts or exposure to COVID-19 patients.    Location:  Severity:  Timing/course:   Onset/Duration:     PCP: Elby Showers, MD    Past History     Past Medical History:  Past Medical History:   Diagnosis Date   ??? Essential hypertension    ??? Hyperlipidemia        Past Surgical History:  Past Surgical History:   Procedure Laterality Date   ??? HX CORONARY STENT PLACEMENT         Family History:  Family History   Problem Relation Age of Onset   ??? No Known Problems Mother    ??? No Known Problems Father        Social History:  Social History     Tobacco Use   ??? Smoking status: Never Smoker   ??? Smokeless tobacco: Never Used   Substance Use Topics   ??? Alcohol use: Yes     Frequency: 2-4 times a month     Drinks per session: 1 or 2     Binge frequency: Never   ??? Drug use: No       Allergies:  Allergies   Allergen Reactions   ??? Morphine Hives and Itching   ??? Penicillins Rash   ??? Compazine [Prochlorperazine] Other (comments)     Jerking movements       Review of Systems   Review of Systems   Constitutional: Positive for fatigue and fever.   All other systems reviewed and are negative.       Physical Exam     Patient Vitals for the past 12 hrs:   Temp Pulse Resp BP SpO2   02/21/19 2130 100.2 ??F (37.9 ??C) (!) 116 26 122/81 96 %       Physical Exam  Vitals signs and nursing note reviewed.   Constitutional:       Appearance: Normal appearance.   HENT:       Head: Normocephalic and atraumatic.   Eyes:      Extraocular Movements: Extraocular movements intact.   Neck:      Musculoskeletal: Normal range of motion and neck supple.   Cardiovascular:      Rate and Rhythm: Tachycardia present.   Pulmonary:      Effort: Pulmonary effort is normal. No respiratory distress.   Abdominal:      General: There is no distension.      Palpations: Abdomen is soft.      Tenderness: There is no abdominal tenderness.   Musculoskeletal: Normal range of motion.         General: No deformity.   Skin:     General: Skin is warm and dry.   Neurological:      General: No focal deficit present.  Mental Status: She is alert and oriented to person, place, and time.   Psychiatric:         Mood and Affect: Mood normal.         Behavior: Behavior normal.         Diagnostic Study Results     Labs -  Recent Results (from the past 12 hour(s))   METABOLIC PANEL, COMPREHENSIVE    Collection Time: 02/21/19 10:31 PM   Result Value Ref Range    Sodium 135 (L) 136 - 145 mmol/L    Potassium 3.9 3.5 - 5.5 mmol/L    Chloride 104 100 - 111 mmol/L    CO2 27 21 - 32 mmol/L    Anion gap 4 3.0 - 18 mmol/L    Glucose 101 (H) 74 - 99 mg/dL    BUN 13 7.0 - 18 MG/DL    Creatinine 0.95 0.6 - 1.3 MG/DL    BUN/Creatinine ratio 14 12 - 20      GFR est AA >60 >60 ml/min/1.73m    GFR est non-AA 60 (L) >60 ml/min/1.762m   Calcium 8.0 (L) 8.5 - 10.1 MG/DL    Bilirubin, total 1.3 (H) 0.2 - 1.0 MG/DL    ALT (SGPT) 134 (H) 13 - 56 U/L    AST (SGOT) 140 (H) 10 - 38 U/L    Alk. phosphatase 307 (H) 45 - 117 U/L    Protein, total 6.6 6.4 - 8.2 g/dL    Albumin 3.1 (L) 3.4 - 5.0 g/dL    Globulin 3.5 2.0 - 4.0 g/dL    A-G Ratio 0.9 0.8 - 1.7     CBC WITH AUTOMATED DIFF    Collection Time: 02/21/19 10:31 PM   Result Value Ref Range    WBC 4.7 4.6 - 13.2 K/uL    RBC 4.33 4.20 - 5.30 M/uL    HGB 12.9 12.0 - 16.0 g/dL    HCT 37.2 35.0 - 45.0 %    MCV 85.9 74.0 - 97.0 FL    MCH 29.8 24.0 - 34.0 PG    MCHC 34.7 31.0 - 37.0 g/dL     RDW 17.9 (H) 11.6 - 14.5 %    PLATELET 84 (L) 135 - 420 K/uL    MPV 10.9 9.2 - 11.8 FL    NEUTROPHILS 68 40 - 73 %    LYMPHOCYTES 16 (L) 21 - 52 %    MONOCYTES 16 (H) 3 - 10 %    EOSINOPHILS 0 0 - 5 %    BASOPHILS 0 0 - 2 %    ABS. NEUTROPHILS 3.1 1.8 - 8.0 K/UL    ABS. LYMPHOCYTES 0.8 (L) 0.9 - 3.6 K/UL    ABS. MONOCYTES 0.8 0.05 - 1.2 K/UL    ABS. EOSINOPHILS 0.0 0.0 - 0.4 K/UL    ABS. BASOPHILS 0.0 0.0 - 0.1 K/UL    DF AUTOMATED      PLATELET COMMENTS DECREASED PLATELETS      RBC COMMENTS ANISOCYTOSIS  1+        RBC COMMENTS POLYCHROMASIA  1+        RBC COMMENTS STOMATOCYTES  1+       PROCALCITONIN    Collection Time: 02/21/19 10:31 PM   Result Value Ref Range    Procalcitonin 0.50 ng/mL   C REACTIVE PROTEIN, QT    Collection Time: 02/21/19 10:31 PM   Result Value Ref Range    C-Reactive protein 7.5 (H) 0 - 0.3 mg/dL  LD    Collection Time: 02/21/19 10:31 PM   Result Value Ref Range    LD 450 (H) 81 - 234 U/L   FERRITIN    Collection Time: 02/21/19 10:31 PM   Result Value Ref Range    Ferritin 548 (H) 8 - 388 NG/ML   TROPONIN I    Collection Time: 02/21/19 10:31 PM   Result Value Ref Range    Troponin-I, QT <0.02 0.0 - 0.045 NG/ML   PROTHROMBIN TIME + INR    Collection Time: 02/21/19 11:18 PM   Result Value Ref Range    Prothrombin time 15.1 11.5 - 15.2 sec    INR 1.2 0.8 - 1.2     PTT    Collection Time: 02/21/19 11:18 PM   Result Value Ref Range    aPTT 29.5 23.0 - 36.4 SEC   POC LACTIC ACID    Collection Time: 02/21/19 11:22 PM   Result Value Ref Range    Lactic Acid (POC) 1.34 0.40 - 2.00 mmol/L       Radiologic Studies -   Xr Chest Port    Result Date: 02/21/2019  IMPRESSION: No acute findings.        Medical Decision Making     ED Course: Progress Notes, Reevaluation, and Consults:    12:03 AM Initial assessment performed. The patients presenting problems have been discussed, and they/their family are in agreement with the care plan formulated and outlined with them.  I have encouraged them to ask  questions as they arise throughout their visit.        Provider Notes (Medical Decision Making): 63 year old female with history of hypertension presenting with generalized fatigue and fever.  Triggered sepsis protocol and broad-spectrum antibiotics were also initiated.  She is hemodynamically stable so we will provide fluids as needed.  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    Procedures:     Critical Care Time:     Vital Signs-Reviewed the patient's vital signs. Reviewed pt's pulse ox reading.     EKG:  Interpreted by the EP.   Time Interpreted:    Rate:    Rhythm:    Interpretation:   Comparison:     Records Reviewed: Nursing Notes (Time of Review: 12:03 AM)  -I am the first provider for this patient.  -I reviewed the vital signs, available nursing notes, past medical history, past surgical history, family history and social history.    Current Facility-Administered Medications   Medication Dose Route Frequency Provider Last Rate Last Dose   ??? sodium chloride (NS) flush 5-10 mL  5-10 mL IntraVENous PRN Jalal Rauch A, MD       ??? piperacillin-tazobactam (ZOSYN) 3.375 g in 0.9% sodium chloride (MBP/ADV) 100 mL MBP  3.375 g IntraVENous ONCE Karne Ozga A, MD 200 mL/hr at 02/22/19 0001 3.375 g at 02/22/19 0001    Followed by   ??? piperacillin-tazobactam (ZOSYN) 3.375 g in 0.9% sodium chloride (MBP/ADV) 100 mL MBP  3.375 g IntraVENous Q6H Stclair Szymborski A, MD       ??? vancomycin (VANCOCIN) 2000 mg in NS 500 ml infusion  2,000 mg IntraVENous ONCE Tashae Inda A, MD       ??? VANCOMYCIN: Pharmacy to dose   Other Rx Dosing/Monitoring Miroslav Gin A, MD         Current Outpatient Medications   Medication Sig Dispense Refill   ??? methscopolamine (PAMINE)  2.5 mg tab tablet Take  by mouth. Take 6 tabs by mouth every 7 days.     ??? thyroid, Pork, (ARMOUR) 90 mg tablet Take  by mouth daily.     ??? ergocalciferol (VITAMIN D2) 50,000 unit capsule Take 50,000 Units by  mouth.     ??? traMADol (ULTRAM) 50 mg tablet Take 50 mg by mouth every six (6) hours as needed for Pain.     ??? hydroxychloroquine (PLAQUENIL) 200 mg tablet Take 200 mg by mouth two (2) times a day.     ??? folic acid (FOLVITE) 1 mg tablet Take  by mouth daily.     ??? esomeprazole (NEXIUM) 40 mg capsule Take  by mouth daily.     ??? ticagrelor (BRILINTA) 90 mg tablet Take  by mouth two (2) times a day.     ??? simvastatin (ZOCOR) 20 mg tablet Take  by mouth nightly.     ??? oxyCODONE-acetaminophen (PERCOCET) 5-325 mg per tablet Take  by mouth every four (4) hours as needed for Pain.     ??? pramipexole (MIRAPEX) 1 mg tablet Take 1 Tab by mouth nightly. 15 Tab 0        Clinical Impression     Clinical Impression: No diagnosis found.    Disposition: dc      DISCHARGE NOTE:     Pt has been reexamined.  Patient has no new complaints, changes, or physical findings.  Care plan outlined and precautions discussed.  Results of labs and imaging were reviewed with the patient. All medications were reviewed with the patient; will d/c home with return precautions. All of pt's questions and concerns were addressed. Patient was instructed and agrees to follow up with PCP, as well as to return to the ED upon further deterioration. Patient is ready to go home.    This note was dictated utilizing voice recognition software which may lead to typographical errors.  I apologize in advance if the situation occurs.  If questions arise please do not hesitate to contact me or call our department.    Terrica Duecker A [place] Ropes, MD  12:03 AM

## 2019-02-22 NOTE — Telephone Encounter (Signed)
Patient contacted regarding recent visit for viral symptoms.  This Pryor Curia contacted the patient by telephone to perform post discharge call. Verified name and DOB with patient as identifiers. Provided introduction to self, and reason for call due to viral symptoms of infection and/or exposure to COVID-19.        Patient presented to emergency department/flu clinic with complaints of viral symptoms/exposure to COVID. Patient reports symptoms are improving. Due to no new or worsening symptoms the RN CTN/ACM was not notified for escalation.   Discussed exposure protocols and quarantine with CDC Guidelines What To Do If You Are Sick    Patient was given an opportunity for questions and concerns.     Stay home except to get medical care  Separate yourself from other people and animals in your home  Call ahead before visiting your doctor  Wear a facemask  Cover your coughs and sneezes  Clean your hands often  Avoid sharing personal household items  Clean all ???high-touch??? surfaces everyday    Monitor your symptoms  Seek prompt medical attention if your illness is worsening (e.g., difficulty breathing). Before seeking care, call your healthcare provider and tell them that you have, or are being evaluated for, COVID-19. Put on a facemask before you enter the facility. These steps will help the healthcare provider's office to keep other people in the office or waiting room from getting infected or exposed. Ask your healthcare provider to call the local or state health department. Persons who are placed under If you have a medical emergency and need to call 911, notify the dispatch personnel that you have, or are being evaluated for COVID-19. If possible, put on a facemask before emergency medical services arrive.    The patient agrees to contact the [facility] exposure line [phone], local health department [place] [facility]  [phone] and PCP office for questions related to their healthcare. Chief Strategy Officer provided  contact information for future reference.    Patient/family/caregiver given information for [employer] and agrees to enroll yes  Patient preferred e-mail: [email or web address]  Patient preferred phone contact: [phone]  Based on Loop alert triggers, patient will be contacted by nurse care manager for worsening symptoms.

## 2019-02-23 NOTE — Progress Notes (Signed)
Date/Time:  02/23/2019 8:32 AM    Patient contacted regarding Loop Alert received.  Verified patients name and DOB as identifiers.    RN contacted the patient by telephone regarding yellow Loop alert.    Patient reported the following symptoms: fever, vomiting, diarrhea, no new symptoms and no worsening symptoms. Diarrhea has been on-gong for rhe last few months. COVID-19 test results are not back as of yet.    Due to continued symptoms, patient was advised to self monitor symptoms at home.      Due to no new or worsening symptoms encounter was not routed to provider for escalation.     Education provided regarding infection prevention, and signs and symptoms of COVID-19 and when to seek medical attention with patient who verbalized understanding.     Discussed exposure protocols and quarantine from [facility] Guidelines "Are you at higher risk for severe illness 2019" and given an opportunity for questions and concerns.     The patient agrees to contact agrees to contact their provider, COVID-19 hotline [phone], or local health department [place] [facility]  [phone] for questions related to their healthcare. Chief Strategy Officer provided contact information for future needs.    From CDC: Are you at higher risk for severe illness?    Wendee Copp your hands often.  Marland Kitchen Avoid close contact (6 feet, which is about two arm lengths) with people who are sick.  . Put distance between yourself and other people if COVID-19 is spreading in your community.  . Clean and disinfect frequently touched surfaces.  . Avoid all cruise travel and non-essential air travel.  . Call your healthcare professional if you have concerns about COVID-19 and your underlying condition or if you are sick.    For more information on steps you can take to protect yourself, see CDC's How to Protect Yourself      Patient will continue to self report symptoms using Loop self monitoring.  Patient has RN's contact information.

## 2019-02-23 NOTE — Progress Notes (Signed)
Date/Time:  02/23/2019 8:32 AM    Patient contacted regarding Loop Alert received.  Verified patients name and DOB as identifiers.    RN contacted the patient by telephone regarding yellow Loop alert.    Patient reported the following symptoms: fever, vomiting, diarrhea, no new symptoms and no worsening symptoms. Diarrhea has been on-gong for rhe last few months. COVID-19 test results are not back as of yet.    Due to continued symptoms, patient was advised to self monitor symptoms at home.      Due to no new or worsening symptoms encounter was not routed to provider for escalation.     Education provided regarding infection prevention, and signs and symptoms of COVID-19 and when to seek medical attention with patient who verbalized understanding.     Discussed exposure protocols and quarantine from [facility] Guidelines ???Are you at higher risk for severe illness 2019??? and given an opportunity for questions and concerns.     The patient agrees to contact agrees to contact their provider, COVID-19 hotline [phone], or local health department [place] [facility]  [phone] for questions related to their healthcare. Chief Strategy Officer provided contact information for future needs.    From CDC: Are you at higher risk for severe illness?    ??? Wash your hands often.  ??? Avoid close contact (6 feet, which is about two arm lengths) with people who are sick.  ??? Put distance between yourself and other people if COVID-19 is spreading in your community.  ??? Clean and disinfect frequently touched surfaces.  ??? Avoid all cruise travel and non-essential air travel.  ??? Call your healthcare professional if you have concerns about COVID-19 and your underlying condition or if you are sick.    For more information on steps you can take to protect yourself, see CDC's How to Protect Yourself      Patient will continue to self report symptoms using Loop self monitoring.  Patient has RN's contact information.

## 2019-02-25 LAB — NOVEL CORONAVIRUS (COVID-19): SARS-CoV-2: NOT DETECTED

## 2019-02-25 LAB — COVID-19: SARS-CoV-2: NOT DETECTED

## 2019-02-27 LAB — CULTURE, BLOOD
Culture result:: NO GROWTH
Culture result:: NO GROWTH

## 2019-02-27 LAB — CULTURE, BLOOD 1
Culture: NO GROWTH
Culture: NO GROWTH

## 2019-03-01 NOTE — Telephone Encounter (Signed)
Out-of-office contact:  Received a call from patient reporting that she missed a call from somebody giving her lab results from last week when she was seen at the ER. Reviewed patient???s record and I informed her of negative COVID-19 test. Advised patient to follow-up with PCP and it was then that she stated that she is currently at [facility] ICU for sepsis of unclear etiology.

## 2020-09-13 ENCOUNTER — Inpatient Hospital Stay
Admit: 2020-09-13 | Discharge: 2020-09-13 | Disposition: A | Discharge status: Home / Self Care | Payer: TRICARE (CHAMPUS) | Attending: Student in an Organized Health Care Education/Training Program

## 2020-09-13 ENCOUNTER — Emergency Department: Admit: 2020-09-13 | Payer: TRICARE (CHAMPUS) | Primary: Internal Medicine

## 2020-09-13 DIAGNOSIS — R079 Chest pain, unspecified: Secondary | ICD-10-CM

## 2020-09-13 LAB — CBC WITH AUTOMATED DIFF
BASOPHILS: 0.6 % (ref 0–3)
EOSINOPHILS: 0.9 % (ref 0–5)
HCT: 43.1 % (ref 37.0–50.0)
HGB: 14.5 gm/dl (ref 13.0–17.2)
IMMATURE GRANULOCYTES: 0.4 % (ref 0.0–3.0)
LYMPHOCYTES: 19.7 % — ABNORMAL LOW (ref 28–48)
MCH: 31.6 pg (ref 25.4–34.6)
MCHC: 33.6 gm/dl (ref 30.0–36.0)
MCV: 93.9 fL (ref 80.0–98.0)
MONOCYTES: 8.1 % (ref 1–13)
MPV: 11.4 fL — ABNORMAL HIGH (ref 6.0–10.0)
NEUTROPHILS: 70.3 % — ABNORMAL HIGH (ref 34–64)
NRBC: 0 (ref 0–0)
PLATELET: 237 10*3/uL (ref 140–450)
RBC: 4.59 M/uL (ref 3.60–5.20)
RDW-SD: 50.4 — ABNORMAL HIGH (ref 36.4–46.3)
WBC: 5.3 10*3/uL (ref 4.0–11.0)

## 2020-09-13 LAB — POC URINE MACROSCOPIC
Bilirubin, Urine: NEGATIVE
Bilirubin: NEGATIVE
Blood, Urine: NEGATIVE
Blood: NEGATIVE
Glucose, Ur: NEGATIVE mg/dl
Glucose: NEGATIVE mg/dl
Ketone: NEGATIVE mg/dl
Ketones, Urine: NEGATIVE mg/dl
Leukocyte Esterase, Urine: NEGATIVE
Leukocyte Esterase: NEGATIVE
Nitrite, Urine: NEGATIVE
Nitrites: NEGATIVE
Protein, UA: NEGATIVE mg/dl
Protein: NEGATIVE mg/dl
Specific Gravity, UA: 1.025 (ref 1.005–1.030)
Specific gravity: 1.025 (ref 1.005–1.030)
Urobilinogen, UA, POCT: 0.2 EU/dl (ref 0.0–1.0)
Urobilinogen: 0.2 EU/dl (ref 0.0–1.0)
pH (UA): 6 (ref 5–9)
pH, UA: 6 (ref 5–9)

## 2020-09-13 LAB — EKG, 12 LEAD, INITIAL
Atrial Rate: 104 {beats}/min
Calculated P Axis: 118 degrees
Calculated R Axis: 141 degrees
Calculated T Axis: 139 degrees
P-R Interval: 150 ms
Q-T Interval: 344 ms
QRS Duration: 72 ms
QTC Calculation (Bezet): 452 ms
Ventricular Rate: 104 {beats}/min

## 2020-09-13 LAB — TROPONIN-HIGH SENSITIVITY
Troponin-High Sensitivity: 17 ng/L (ref 0–59)
Troponin-High Sensitivity: 17 ng/L (ref 0–59)

## 2020-09-13 LAB — METABOLIC PANEL, BASIC
Anion gap: 5 mmol/L (ref 5–15)
BUN: 15 mg/dl (ref 7–25)
CO2: 25 mEq/L (ref 21–32)
Calcium: 9.1 mg/dl (ref 8.5–10.1)
Chloride: 108 mEq/L — ABNORMAL HIGH (ref 98–107)
Creatinine: 0.7 mg/dl (ref 0.6–1.3)
GFR est AA: >60
GFR est non-AA: >60
Glucose: 99 mg/dl (ref 74–106)
Potassium: 4.2 mEq/L (ref 3.5–5.1)
Sodium: 138 mEq/L (ref 136–145)

## 2020-09-13 LAB — D DIMER: D DIMER: 0.27 ug/mL (FEU) (ref 0.01–0.50)

## 2020-09-13 LAB — TROPONIN, HIGH SENSITIVITY
Troponin, High Sensitivity: 17 ng/L (ref 0–59)
Troponin, High Sensitivity: 17 ng/L (ref 0–59)

## 2020-09-13 LAB — EKG 12-LEAD
Atrial Rate: 104 {beats}/min
P Axis: 118 degrees
P-R Interval: 150 ms
Q-T Interval: 344 ms
QRS Duration: 72 ms
QTc Calculation (Bazett): 452 ms
R Axis: 141 degrees
T Axis: 139 degrees
Ventricular Rate: 104 {beats}/min

## 2020-09-13 LAB — CBC WITH AUTO DIFFERENTIAL
Basophils %: 0.6 % (ref 0–3)
Eosinophils %: 0.9 % (ref 0–5)
Hematocrit: 43.1 % (ref 37.0–50.0)
Hemoglobin: 14.5 gm/dl (ref 13.0–17.2)
Immature Granulocytes: 0.4 % (ref 0.0–3.0)
Lymphocytes %: 19.7 % — ABNORMAL LOW (ref 28–48)
MCH: 31.6 pg (ref 25.4–34.6)
MCHC: 33.6 gm/dl (ref 30.0–36.0)
MCV: 93.9 fL (ref 80.0–98.0)
MPV: 11.4 fL — ABNORMAL HIGH (ref 6.0–10.0)
Monocytes %: 8.1 % (ref 1–13)
Neutrophils %: 70.3 % — ABNORMAL HIGH (ref 34–64)
Nucleated RBCs: 0 (ref 0–0)
Platelets: 237 10*3/uL (ref 140–450)
RBC: 4.59 M/uL (ref 3.60–5.20)
RDW-SD: 50.4 — ABNORMAL HIGH (ref 36.4–46.3)
WBC: 5.3 10*3/uL (ref 4.0–11.0)

## 2020-09-13 LAB — BASIC METABOLIC PANEL
Anion Gap: 5 mmol/L (ref 5–15)
BUN: 15 mg/dl (ref 7–25)
CO2: 25 mEq/L (ref 21–32)
Calcium: 9.1 mg/dl (ref 8.5–10.1)
Chloride: 108 mEq/L — ABNORMAL HIGH (ref 98–107)
Creatinine: 0.7 mg/dl (ref 0.6–1.3)
EGFR IF NonAfrican American: >60
GFR African American: >60
Glucose: 99 mg/dl (ref 74–106)
Potassium: 4.2 mEq/L (ref 3.5–5.1)
Sodium: 138 mEq/L (ref 136–145)

## 2020-09-13 LAB — D-DIMER, QUANTITATIVE: D-Dimer, Quant: 0.27 ug/mL (FEU) (ref 0.01–0.50)

## 2020-09-13 MED ORDER — SUCRALFATE 100 MG/ML ORAL SUSP
100 mg/mL | Freq: Four times a day (QID) | ORAL | 0 refills | Status: AC
Start: 2020-09-13 — End: ?

## 2020-09-13 MED ORDER — ESOMEPRAZOLE MAGNESIUM 20 MG CAP, DELAYED RELEASE
20 mg | ORAL_CAPSULE | Freq: Every day | ORAL | 0 refills | Status: AC
Start: 2020-09-13 — End: 2020-10-03

## 2020-09-13 MED ORDER — LIDOCAINE 2 % MUCOSAL SOLN
2 % | Status: AC
Start: 2020-09-13 — End: 2020-09-13
  Administered 2020-09-13: 18:00:00 via OROMUCOSAL

## 2020-09-13 MED ORDER — SUCRALFATE 100 MG/ML ORAL SUSP
100 mg/mL | ORAL | Status: AC
Start: 2020-09-13 — End: 2020-09-13
  Administered 2020-09-13: 18:00:00 via ORAL

## 2020-09-13 MED ORDER — ALUM-MAG HYDROXIDE-SIMETH 400 MG-400 MG-40 MG/5 ML ORAL SUSP
400-400-40 mg/5 mL | ORAL | Status: AC
Start: 2020-09-13 — End: 2020-09-13
  Administered 2020-09-13: 18:00:00 via ORAL

## 2020-09-13 MED FILL — LIDOCAINE VISCOUS 2 % MUCOSAL SOLUTION: 2 % | Qty: 15

## 2020-09-13 MED FILL — MAG-AL PLUS EXTRA STRENGTH 400 MG-400 MG-40 MG/5 ML ORAL SUSPENSION: 400-400-40 mg/5 mL | ORAL | Qty: 30

## 2020-09-13 MED FILL — SUCRALFATE 100 MG/ML ORAL SUSP: 100 mg/mL | ORAL | Qty: 10

## 2020-09-13 NOTE — ED Notes (Signed)
I have reviewed discharge instructions with the patient.  The patient verbalized understanding.

## 2020-09-13 NOTE — ED Provider Notes (Signed)
[facility]  Emergency Department Treatment Report    Patient: [name] [name] Age: 64 y.o. Sex: female    Date of Birth: [birth year] Admit Date: (Not on file) PCP: Elby Showers, MD   MRN: [id number]  CSN: [id number]     Room: Room/bed info not found Time Dictated: 12:36 PM      Payor: TRICARE / Plan: [facility] TRICARE HE[employer] / Product Type: [employer] /     Chief Complaint   Chief Complaint   Patient presents with   . Abdominal Pain       History of Present Illness   64 y.o. female presents via private auto for complaint of left-sided chest pain she is concerned is related to her known hiatal hernia.  She reports pain started 2 days ago, and has been gradually worsening.  She describes pain as a "pressure" in the left chest with radiation into the left upper back.  She reports pain is currently "7/10" in severity, and is "9/10" at its worst.  She reports pain is worsened with lying flat and with eating.  She reports nothing seems to make pain better.  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  She reports her last chemotherapy treatment was "10 months ago."  She is currently on Methotrexate and Plaquenil for rheumatoid arthritis.    She denies any recent surgery, immobility or significant travel history.  She denies prior history of deep vein thrombosis (DVT) or pulmonary embolism (PE).    Review of Systems   Review of Systems   Constitutional: Negative for chills, fever and malaise/fatigue.   HENT: Negative for congestion, sinus pain and sore throat.    Eyes: Negative for blurred vision and double vision.   Respiratory: Negative for cough, hemoptysis, sputum production and shortness of breath.    Cardiovascular: Positive for chest pain and palpitations ("spastic" heart beat occasionally). Negative for leg swelling.    Gastrointestinal: Positive for abdominal pain (epigastric) and heartburn. Negative for blood in stool, constipation, diarrhea, nausea and vomiting.   Genitourinary: Positive for flank pain (left-sided). Negative for dysuria, frequency, hematuria and urgency.   Musculoskeletal: Positive for back pain (left thoracic back) and joint pain (left shoulder). Negative for neck pain.   Skin: Positive for itching (diffuse, chronic). Negative for rash.   Neurological: Positive for dizziness ("lightheadedness and unsteadiness"). Negative for sensory change, focal weakness and headaches.   Endo/Heme/Allergies: Negative for environmental allergies. Bruises/bleeds easily.        She reports she is on Plavix with report of easy bruising.   Psychiatric/Behavioral: Negative for depression and substance abuse. The patient is not nervous/anxious.      Past Medical/Surgical History     Past Medical History:   Diagnosis Date   . Coronary artery disease    . Essential hypertension    . Hyperlipidemia    . Lymphoma ([place])     giant b cell   . Rheumatoid arthritis [facility])      Past Surgical History:   Procedure Laterality Date   . HX CORONARY STENT PLACEMENT         Social History     Social History     Socioeconomic History   . Marital status: MARRIED   Tobacco Use   . Smoking status: Never Smoker   . Smokeless tobacco: Never  Used   Substance and Sexual Activity   . Alcohol use: Yes   . Drug use: No       Family History     Family History   Problem Relation Age of Onset   . No Known Problems Mother    . No Known Problems Father        Current Medications     Current Outpatient Medications   Medication Instructions   . ergocalciferol (VITAMIN D2) 50,000 Units, Oral   . esomeprazole (NEXIUM) 40 mg capsule Oral, DAILY   . folic acid (FOLVITE) 1 mg tablet Oral, DAILY   . hydrOXYchloroQUINE (PLAQUENIL) 200 mg, Oral, 2 TIMES DAILY   . methscopolamine (PAMINE) 2.5 mg tab tablet Oral, Take 6 tabs by mouth every 7 days.    Marland Kitchen oxyCODONE-acetaminophen  (PERCOCET) 5-325 mg per tablet Oral, EVERY 4 HOURS AS NEEDED   . pramipexole (MIRAPEX) 1 mg, Oral, EVERY BEDTIME   . simvastatin (ZOCOR) 20 mg tablet Oral, EVERY BEDTIME   . thyroid, Pork, (ARMOUR) 90 mg tablet Oral, DAILY   . ticagrelor (BRILINTA) 90 mg tablet Oral, 2 TIMES DAILY   . traMADoL (ULTRAM) 50 mg, Oral, EVERY 6 HOURS AS NEEDED     Allergies     Allergies   Allergen Reactions   . Morphine Hives and Itching   . Penicillins Rash   . Compazine [Prochlorperazine] Other (comments)     Jerking movements       Physical Exam     ED Triage Vitals   ED Encounter Vitals Group      BP 09/13/20 1050 128/84      Pulse (Heart Rate) 09/13/20 1050 93      Resp Rate 09/13/20 1050 18      Temp 09/13/20 1050 98.2 F (36.8 C)      Temp src --       O2 Sat (%) 09/13/20 1050 99 %      Weight 09/13/20 1031 210 lb      Height 09/13/20 1031 5\' 6"      I have reviewed documented vital signs, which are within age-appropriate parameters.   I have reviewed nursing documentation.    Physical Exam  Vitals and nursing note reviewed.   Constitutional:       General: She is in acute distress (appears mildly uncomfortable).   HENT:      Head: Normocephalic and atraumatic.      Right Ear: External ear normal.      Left Ear: External ear normal.      Mouth/Throat:      Mouth: Mucous membranes are moist.      Pharynx: Oropharynx is clear.   Eyes:      Conjunctiva/sclera: Conjunctivae normal.   Cardiovascular:      Rate and Rhythm: Normal rate and regular rhythm.      Heart sounds: No murmur heard.      Pulmonary:      Effort: Pulmonary effort is normal. No respiratory distress.      Breath sounds: Normal breath sounds.   Abdominal:      General: Bowel sounds are normal. There is no distension.      Palpations: Abdomen is soft.      Tenderness: There is abdominal tenderness (mild tenderness, epigastrium). There is no right CVA tenderness, left CVA tenderness, guarding or rebound.   Musculoskeletal:         General: No tenderness or deformity.       Right lower  leg: No edema.      Left lower leg: No edema.   Skin:     General: Skin is warm and dry.      Findings: No rash.   Neurological:      General: No focal deficit present.      Mental Status: She is alert and oriented to person, place, and time.   Psychiatric:         Mood and Affect: Mood normal.         Behavior: Behavior normal.       Diagnostic Studies   Lab:   Recent Results (from the past 12 hour(s))   CBC WITH AUTOMATED DIFF    Collection Time: 09/13/20 10:50 AM   Result Value Ref Range    WBC 5.3 4.0 - 11.0 1000/mm3    RBC 4.59 3.60 - 5.20 M/uL    HGB 14.5 13.0 - 17.2 gm/dl    HCT 43.1 37.0 - 50.0 %    MCV 93.9 80.0 - 98.0 fL    MCH 31.6 25.4 - 34.6 pg    MCHC 33.6 30.0 - 36.0 gm/dl    PLATELET 237 140 - 450 1000/mm3    MPV 11.4 (H) 6.0 - 10.0 fL    RDW-SD 50.4 (H) 36.4 - 46.3      NRBC 0 0 - 0      IMMATURE GRANULOCYTES 0.4 0.0 - 3.0 %    NEUTROPHILS 70.3 (H) 34 - 64 %    LYMPHOCYTES 19.7 (L) 28 - 48 %    MONOCYTES 8.1 1 - 13 %    EOSINOPHILS 0.9 0 - 5 %    BASOPHILS 0.6 0 - 3 %   METABOLIC PANEL, BASIC    Collection Time: 09/13/20 10:50 AM   Result Value Ref Range    Sodium 138 136 - 145 mEq/L    Potassium 4.2 3.5 - 5.1 mEq/L    Chloride 108 (H) 98 - 107 mEq/L    CO2 25 21 - 32 mEq/L    Glucose 99 74 - 106 mg/dl    BUN 15 7 - 25 mg/dl    Creatinine 0.7 0.6 - 1.3 mg/dl    GFR est AA >60.0      GFR est non-AA >60      Calcium 9.1 8.5 - 10.1 mg/dl    Anion gap 5 5 - 15 mmol/L   TROPONIN-HIGH SENSITIVITY    Collection Time: 09/13/20 10:50 AM   Result Value Ref Range    Troponin-High Sensitivity 17 0 - 59 ng/L     Based on my interpretation the high-sensitivity troponin is noted to be unremarkable.  Based on my interpretation the CBC shows no evidence of significant anemia, leukocytosis, or other significant acute abnormalities.  Based on my interpretation the BMP shows no evidence of significant electrolyte abnormalities, renal impairment, or other significant acute abnormalities.  I have  reviewed all ordered labs and used them in my medical decision making (MDM) as documented below.    Imaging:    XR CHEST PA LAT    Result Date: 09/13/2020  INDICATION: pain; COMPARISON: None. TECHNIQUE: Frontal and lateral views of the chest. FINDINGS: Right chest wall Mediport tip terminates at the cavoatrial junction. No consolidation, pleural effusion, or pulmonary edema. No pneumothorax. Normal cardiomediastinal contours. A coronary artery stent is noted. Thoracic spine is normal for age.     IMPRESSION: No acute cardiopulmonary process.     Based on my interpretation the chest x-ray  shows no evidence of consolidations, effusions, or other acute intrathoracic abnormalities.  I have reviewed all ordered imaging studies and used them in my medical decision making (MDM) as documented below.    EKG Interpretation    An EKG was obtained in the evaluation of this patient at 1039, and read at 1041.  I have personally read and interpreted this EKG as follows:    EKG demonstrates sinus tachycardia at a rate of 105 beats per minute with a normal axis. PR interval is normal at 154 ms.  QRS interval is normal at 74 ms. QT interval is normal at 356 ms. There are no ST segment or T wave changes suggestive of acute ischemia or infarction.      Leonides Cave, DO  September 13, 2020    I have reviewed all ordered EKGs and used them in my medical decision making (MDM) as documented below.    Progress Notes     3:18 PM  Reassessed patient reports significant provement after antacid cocktail here in the ED.  We discussed options for further evaluation given her negative initial and repeat troponin in the setting of this chest pain.  Patient is convinced that this is more related to her hiatal hernia given the significant relief with antacid cocktail.  We discussed that it is no important that she consider follow-up for further stress testing.  We offered the patient admission to ED observation for stress testing in the morning, but  she prefers to follow-up with her primary care provider and cardiologist for further evaluation as an outpatient.    Clinical Decision Tools     HEART Score    History Slightly suspicious 0 points    Moderately suspicious     Highly suspicious      EKG  Normal 0 points    Non-specific repolarization disturbance  No ST deviation but LBBB, LVH, repolarization changes     Significant ST deviation  ST deviation not due to LBBB, LVH or digoxin        Age < 34 years old     64-58 years old +1 point    ? 64 years old      Risk Factors   No known risk factors     1-2 risk factors  HTN, hypercholesterolemia, DM, obesity (BMI >30 kg/m2), smoking (current or cessation ? 3 months), positive family history (parent or sibling with CVD before age 54)     ? 3 risk factors or history of atherosclerotic disease  Prior MI, PCI/CABG, CVA/TIA, or peripheral arterial disease +2 points     Initial Troponin   ? normal limit 0 points    1-3 times normal limit     > 3 times normal limit      TOTAL SCORE 3 points     Interpretation:  -0-3 = low score, 0.9-1.7% risk of major adverse cardiac event  -4-6 = moderate score, 12-16.6% risk of major adverse cardiac event  -?7 = high score, 50-65% risk of major adverse cardiac event  Major adverse cardiac event (MACE) is defined as: all-cause mortality, myocardial infarction, or coronary revascularization.    Medical Decision Making   Chief complaint: Chest Pain    64 y.o. female with chest pain with history and exam consistent with likely gastritis in the setting of known hiatal hernia.    Initial consideration in this patient included angina pectoris, acute coronary syndromes (ACS) including stable and unstable angina, dysrhythmias, pulmonary embolism (PE), aortic dissection, pneumothorax, pneumonia,  esophageal etiologies, chest wall pain including costochondritis, and pleuritis among others.    Patient presented with left-sided chest pain she described as radiating up from the abdomen and burning  in nature.  Patient noted to have a history of a hiatal hernia and prior episodes of pain described as burning and consistent with reflux.  Patient noted to have a history of coronary artery disease, with report that her pain was not consistent with anginal pain she has had in the past.  A 12-lead EKG was obtained with no evidence of acute ischemia, infarction or dysrhythmia.  Patient noted to have a chest x-ray with no evidence of acute intrathoracic processes.  Patient noted to have an initial and repeat high-sensitivity troponin within the normal limits.  Patient noted to have resolution of pain with antacid cocktail.  Patient offered admission to ED Observation for further stress testing, but wished to follow up with her primary care provider and cardiology as an outpatient.    Prior to discharge we discussed return precautions, specifically for worsening or persistent pain, treatment with antacid medications and proton-pump inhibitor, and follow up with primary care doctor, cardiology and gastroenterology for further evaluation, and the patient demonstrated understanding and agreement with this plan.    Medications   aluminum & magnesium hydroxide-simethicone (MYLANTA II) oral suspension 30 mL (30 mL Oral Given 09/13/20 1304)   sucralfate (CARAFATE) 100 mg/mL oral suspension 1 g (1 g Oral Given 09/13/20 1305)   lidocaine (XYLOCAINE) 2 % viscous solution 10 mL (10 mL Mouth/Throat Given 09/13/20 1305)       Final Diagnosis       ICD-10-CM ICD-9-CM   1. Chest pain, unspecified type  R07.9 786.50   2. Other acute gastritis without hemorrhage  K29.00 535.00   3. Hiatal hernia  K44.9 553.3   4. History of coronary artery disease  Z86.79 V12.59       Disposition   Discharged home with recommendation for close outpatient follow-up.      Follow-up Information     Follow up With Specialties Details Why Contact Info    Elby Showers, MD Family Medicine In 3 days Follow-up for further evaluation of chest pain felt to be  more likely secondary to gastritis in the setting of known hiatal hernia.  Consider further referral to cardiology for outpatient stress testing. [address]  [place] [address]  [phone]      Sallee Lange, MD Cardiology In 3 days Follow-up for further evaluation of chest pain and for consideration of outpatient stress testing with your cardiologist of the provider listed above. [address]  [address]  [place] [address]  [phone]      Isac Sarna, MD Gastroenterology, Internal Medicine In 1 week Follow-up for further evaluation of gastritis in the setting of hiatal hernia and for consideration of endoscopy. [address]  [address]  [place] [address]  [phone]             Leonides Cave, DO  September 13, 2020    My signature above authenticates this document and my orders, the final    diagnosis (es), discharge prescription (s), and instructions in the Epic    record.  If you have any questions please contact [phone].     Nursing notes have been reviewed by the physician/ advanced practice    Clinician.    Dragon medical dictation software was used for portions of this report. Unintended  voice recognition grammatical errors may occur.

## 2020-09-13 NOTE — ED Notes (Signed)
Second Trop done at 1405 and sent

## 2020-09-13 NOTE — ED Notes (Signed)
Pt arrives with concern for hiatal hernia   States she is having chest pain but swears it is her hernia

## 2020-10-09 ENCOUNTER — Inpatient Hospital Stay: Payer: TRICARE (CHAMPUS)

## 2020-10-09 MED ORDER — LACTATED RINGERS IV
INTRAVENOUS | Status: DC | PRN
Start: 2020-10-09 — End: 2020-10-09
  Administered 2020-10-09: 15:00:00 via INTRAVENOUS

## 2020-10-09 MED ORDER — PROPOFOL 10 MG/ML IV EMUL
10 mg/mL | INTRAVENOUS | Status: DC | PRN
Start: 2020-10-09 — End: 2020-10-09
  Administered 2020-10-09 (×3): via INTRAVENOUS

## 2020-10-09 MED ORDER — FENTANYL CITRATE (PF) 50 MCG/ML IJ SOLN
50 mcg/mL | INTRAMUSCULAR | Status: AC
Start: 2020-10-09 — End: ?

## 2020-10-09 MED ORDER — SODIUM CHLORIDE 0.9 % IJ SYRG
INTRAMUSCULAR | Status: DC | PRN
Start: 2020-10-09 — End: 2020-10-09

## 2020-10-09 MED ORDER — SODIUM CHLORIDE 0.9 % IJ SYRG
Freq: Three times a day (TID) | INTRAMUSCULAR | Status: DC
Start: 2020-10-09 — End: 2020-10-09

## 2020-10-09 MED ORDER — LACTATED RINGERS IV
INTRAVENOUS | Status: DC
Start: 2020-10-09 — End: 2020-10-09
  Administered 2020-10-09: 14:00:00 via INTRAVENOUS

## 2020-10-09 MED FILL — FENTANYL CITRATE (PF) 50 MCG/ML IJ SOLN: 50 mcg/mL | INTRAMUSCULAR | Qty: 2

## 2020-10-09 MED FILL — LACTATED RINGERS IV: INTRAVENOUS | Qty: 1000

## 2020-10-09 NOTE — Anesthesia Post-Procedure Evaluation (Signed)
Procedure(s):  ESOPHAGOGASTRODUODENOSCOPY with biopsy.    general, total IV anesthesia    Anesthesia Post Evaluation      Multimodal analgesia: multimodal analgesia used between 6 hours prior to anesthesia start to PACU discharge  Patient location during evaluation: PACU  Patient participation: complete - patient participated  Level of consciousness: awake  Pain management: adequate  Airway patency: patent  Anesthetic complications: no  Cardiovascular status: acceptable  Respiratory status: acceptable  Hydration status: acceptable        INITIAL Post-op Vital signs:   Vitals Value Taken Time   BP 130/93 10/09/20 1024   Temp     Pulse 71 10/09/20 1002   Resp 18 10/09/20 1002   SpO2 100 % 10/09/20 1025   Vitals shown include unvalidated device data.

## 2020-10-09 NOTE — Anesthesia Pre-Procedure Evaluation (Signed)
Relevant Problems   CARDIOVASCULAR   (+) CAD (coronary artery disease)       Anesthetic History   No history of anesthetic complications            Review of Systems / Medical History  Patient summary reviewed, nursing notes reviewed and pertinent labs reviewed    Pulmonary        Sleep apnea: No treatment           Neuro/Psych   Within defined limits           Cardiovascular    Hypertension: well controlled          CAD and cardiac stents    Exercise tolerance: <4 METS  Comments: Last Cardiac stent 2018 total of 4 stents   GI/Hepatic/Renal     GERD: poorly controlled           Endo/Other        Arthritis     Other Findings   Comments: Lymphoma  Chemo through port         Physical Exam    Airway  Mallampati: III  TM Distance: 4 - 6 cm  Neck ROM: normal range of motion   Mouth opening: Normal     Cardiovascular  Regular rate and rhythm,  S1 and S2 normal,  no murmur, click, rub, or gallop             Dental    Dentition: Edentulous     Pulmonary  Breath sounds clear to auscultation               Abdominal  GI exam deferred       Other Findings            Anesthetic Plan    ASA: 3  Anesthesia type: general          Induction: Intravenous  Anesthetic plan and risks discussed with: Patient      I have informed the patient or their guardian of the nature and purpose of the type of anesthesia, the reasonable alternative anesthetic methods, pertinent foreseeable risks involved and the possibility of complications. I have explained that an alternative form of anesthesia may be required by unexpected conditions arising before or during the procedure. It is understood that general anesthesia may be required for safety or comfort. Questions have been answered to the satisfaction of the patient who accepts the risks and agrees to proceed as planned. The above anesthetic review of medical history, physical exam, tests, assessment, subsequent anesthetic plan and consent have been accomplished pre-procedure.

## 2020-10-09 NOTE — H&P (Signed)
Date of Surgery Update:  [name] [name] was seen and examined.  History and physical has been reviewed. The patient has been examined. There have been no significant clinical changes since the completion of the originally dated History and Physical.    Signed By: Sharman Crate, MD     October 09, 2020 8:12 AM

## 2020-11-06 ENCOUNTER — Emergency Department: Admit: 2020-11-06 | Payer: TRICARE (CHAMPUS) | Primary: Internal Medicine

## 2020-11-06 ENCOUNTER — Inpatient Hospital Stay
Admit: 2020-11-06 | Discharge: 2020-11-06 | Disposition: A | Discharge status: Home / Self Care | Payer: TRICARE (CHAMPUS) | Attending: Emergency Medicine

## 2020-11-06 DIAGNOSIS — K2980 Duodenitis without bleeding: Secondary | ICD-10-CM

## 2020-11-06 LAB — HEPATIC FUNCTION PANEL
ALT (SGPT): 33 U/L (ref 12–78)
ALT: 33 U/L (ref 12–78)
AST (SGOT): 19 U/L (ref 15–37)
AST: 19 U/L (ref 15–37)
Albumin: 3.8 gm/dl (ref 3.4–5.0)
Albumin: 3.8 gm/dl (ref 3.4–5.0)
Alk. phosphatase: 142 U/L — ABNORMAL HIGH (ref 45–117)
Alkaline Phosphatase: 142 U/L — ABNORMAL HIGH (ref 45–117)
Bilirubin, Direct: 0.1 mg/dl (ref 0.0–0.2)
Bilirubin, direct: 0.1 mg/dl (ref 0.0–0.2)
Bilirubin, total: 0.4 mg/dl (ref 0.2–1.0)
Protein, total: 6.9 gm/dl (ref 6.4–8.2)
Total Bilirubin: 0.4 mg/dl (ref 0.2–1.0)
Total Protein: 6.9 gm/dl (ref 6.4–8.2)

## 2020-11-06 LAB — CBC WITH AUTOMATED DIFF
BASOPHILS: 0.5 % (ref 0–3)
EOSINOPHILS: 1.3 % (ref 0–5)
HCT: 43.3 % (ref 37.0–50.0)
HGB: 14.5 gm/dl (ref 13.0–17.2)
IMMATURE GRANULOCYTES: 0.3 % (ref 0.0–3.0)
LYMPHOCYTES: 13.9 % — ABNORMAL LOW (ref 28–48)
MCH: 32.3 pg (ref 25.4–34.6)
MCHC: 33.5 gm/dl (ref 30.0–36.0)
MCV: 96.4 fL (ref 80.0–98.0)
MONOCYTES: 7.9 % (ref 1–13)
MPV: 11.2 fL — ABNORMAL HIGH (ref 6.0–10.0)
NEUTROPHILS: 76.1 % — ABNORMAL HIGH (ref 34–64)
NRBC: 0 (ref 0–0)
PLATELET: 209 10*3/uL (ref 140–450)
RBC: 4.49 M/uL (ref 3.60–5.20)
RDW-SD: 48.3 — ABNORMAL HIGH (ref 36.4–46.3)
WBC: 6.1 10*3/uL (ref 4.0–11.0)

## 2020-11-06 LAB — METABOLIC PANEL, BASIC
Anion gap: 6 mmol/L (ref 5–15)
BUN: 16 mg/dl (ref 7–25)
CO2: 24 mEq/L (ref 21–32)
Calcium: 8.9 mg/dl (ref 8.5–10.1)
Chloride: 108 mEq/L — ABNORMAL HIGH (ref 98–107)
Creatinine: 0.8 mg/dl (ref 0.6–1.3)
GFR est AA: >60
GFR est non-AA: >60
Glucose: 100 mg/dl (ref 74–106)
Potassium: 4 mEq/L (ref 3.5–5.1)
Sodium: 139 mEq/L (ref 136–145)

## 2020-11-06 LAB — EKG, 12 LEAD, INITIAL
Atrial Rate: 87 {beats}/min
Calculated P Axis: 57 degrees
Calculated R Axis: 58 degrees
Calculated T Axis: 63 degrees
Diagnosis: NORMAL
P-R Interval: 168 ms
Q-T Interval: 368 ms
QRS Duration: 74 ms
QTC Calculation (Bezet): 442 ms
Ventricular Rate: 87 {beats}/min

## 2020-11-06 LAB — LIPASE
Lipase: 205 U/L (ref 73–393)
Lipase: 205 U/L (ref 73–393)

## 2020-11-06 LAB — TROPONIN-HIGH SENSITIVITY: Troponin-High Sensitivity: 16 ng/L (ref 0–59)

## 2020-11-06 LAB — CBC WITH AUTO DIFFERENTIAL
Basophils %: 0.5 % (ref 0–3)
Eosinophils %: 1.3 % (ref 0–5)
Hematocrit: 43.3 % (ref 37.0–50.0)
Hemoglobin: 14.5 gm/dl (ref 13.0–17.2)
Immature Granulocytes: 0.3 % (ref 0.0–3.0)
Lymphocytes %: 13.9 % — ABNORMAL LOW (ref 28–48)
MCH: 32.3 pg (ref 25.4–34.6)
MCHC: 33.5 gm/dl (ref 30.0–36.0)
MCV: 96.4 fL (ref 80.0–98.0)
MPV: 11.2 fL — ABNORMAL HIGH (ref 6.0–10.0)
Monocytes %: 7.9 % (ref 1–13)
Neutrophils %: 76.1 % — ABNORMAL HIGH (ref 34–64)
Nucleated RBCs: 0 (ref 0–0)
Platelets: 209 10*3/uL (ref 140–450)
RBC: 4.49 M/uL (ref 3.60–5.20)
RDW-SD: 48.3 — ABNORMAL HIGH (ref 36.4–46.3)
WBC: 6.1 10*3/uL (ref 4.0–11.0)

## 2020-11-06 LAB — BASIC METABOLIC PANEL
Anion Gap: 6 mmol/L (ref 5–15)
BUN: 16 mg/dl (ref 7–25)
CO2: 24 mEq/L (ref 21–32)
Calcium: 8.9 mg/dl (ref 8.5–10.1)
Chloride: 108 mEq/L — ABNORMAL HIGH (ref 98–107)
Creatinine: 0.8 mg/dl (ref 0.6–1.3)
EGFR IF NonAfrican American: >60
GFR African American: >60
Glucose: 100 mg/dl (ref 74–106)
Potassium: 4 mEq/L (ref 3.5–5.1)
Sodium: 139 mEq/L (ref 136–145)

## 2020-11-06 LAB — EKG 12-LEAD
Atrial Rate: 87 {beats}/min
Diagnosis: NORMAL
P Axis: 57 degrees
P-R Interval: 168 ms
Q-T Interval: 368 ms
QRS Duration: 74 ms
QTc Calculation (Bazett): 442 ms
R Axis: 58 degrees
T Axis: 63 degrees
Ventricular Rate: 87 {beats}/min

## 2020-11-06 LAB — TROPONIN, HIGH SENSITIVITY: Troponin, High Sensitivity: 16 ng/L (ref 0–59)

## 2020-11-06 MED ORDER — ALUM-MAG HYDROXIDE-SIMETH 400 MG-400 MG-40 MG/5 ML ORAL SUSP
400-400-40 mg/5 mL | ORAL | Status: DC | PRN
Start: 2020-11-06 — End: 2020-11-06

## 2020-11-06 MED ORDER — SUCRALFATE 100 MG/ML ORAL SUSP
100 mg/mL | ORAL | Status: AC
Start: 2020-11-06 — End: 2020-11-06
  Administered 2020-11-06: 15:00:00 via ORAL

## 2020-11-06 MED ORDER — SODIUM CHLORIDE 0.9% BOLUS IV
0.9 % | INTRAVENOUS | Status: AC
Start: 2020-11-06 — End: 2020-11-06
  Administered 2020-11-06: 15:00:00 via INTRAVENOUS

## 2020-11-06 MED ORDER — ONDANSETRON (PF) 4 MG/2 ML INJECTION
4 mg/2 mL | Freq: Once | INTRAMUSCULAR | Status: AC
Start: 2020-11-06 — End: 2020-11-06
  Administered 2020-11-06: 15:00:00 via INTRAVENOUS

## 2020-11-06 MED FILL — SUCRALFATE 100 MG/ML ORAL SUSP: 100 mg/mL | ORAL | Qty: 10

## 2020-11-06 MED FILL — ONDANSETRON (PF) 4 MG/2 ML INJECTION: 4 mg/2 mL | INTRAMUSCULAR | Qty: 2

## 2020-11-06 NOTE — ED Notes (Signed)
Pt complains of indigestion/feeling like something is stuck in middle of chest since last night. Has hx of this but unsure if this is the same feeling. States she had an EGD last month but it was normal

## 2020-11-06 NOTE — ED Notes (Signed)
Assumed care of Pt.   Pt resting comfortably in chair.   No further needs at this time  Will continue to monitor

## 2020-11-06 NOTE — ED Provider Notes (Signed)
ED Provider Notes by Wallie Renshaw, PA at 11/06/20 0102                Author: Wallie Renshaw, PA  Service: Emergency Medicine  Author Type: Physician Assistant       Filed: 11/06/20 1224  Date of Service: 11/06/20 0828  Status: Attested           Editor: Wallie Renshaw, PA (Physician Assistant)  Cosigner: Serita Sheller, MD at 11/06/20 1827          Attestation signed by Serita Sheller, MD at 11/06/20 1827          I interviewed and examined the patient. I discussed the patient with the advanced practice provider, Julaine Hua, PA-C, and I agree with the evaluation and  plan as documented here.      On my exam the patient is awake and alert sitting upright in a chair in the room.  Her lungs are clear to auscultation cardiac exam is regular rate and rhythm.  She appears uncomfortable.  Some mild tenderness palpation in the epigastric region right  upper quadrant.  No rebound or guarding.      I performed the medical decision making myself and discussed the plan with Julaine Hua, PA-C.      The patient states today's episode began yesterday.  She has had constant pain since then.  She has had nausea associated with it.  She is scheduled to see her primary care provider next week.  No further follow-up scheduled with GI.  She states the pain  is worse if she tries to lay down.  Better when she is sitting upright.      Differential diagnosis includes GERD/gastritis/esophagitis, ACS pulmonary embolism.  Also considered biliary colic.  Normal O2 sat and normal heart rate.  No previous history of DVT/PE.  No pleuritic component.  Low suspicion for PE.  Constant pain for  over 12 hours.  We will check a troponin.  If this is negative I have a very low suspicion for ACS.  Will check a screening EKG and a chest x-ray as well as         Twelve-lead EKG by my interpretation shows sinus rhythm with rate of 87.  PR interval is 116 ms.  QRS narrow complex 74 ms.  QT interval corrected 4 to 42 ms.  I do not  see any acute ST segment T wave changes concerning for ischemia.  Today's EKG appears  unchanged from 09/13/2020.      I reviewed the electronic medical records.  In [facility] the AST and ALT were normal August 15, 2020.  There are abnormal LFTs from 02/21/2019 in our system.      Labs today show normal WBC.  Hemoglobin hematocrit and platelets are normal.  Differential with 76% neutrophils.  Electrolytes are unremarkable.  Glucose and renal function are normal.  High-sensitivity opponent is 16.  This would make ACS quite unlikely  with constant pain since yesterday.  Also EKG is unchanged.  I have added on LFTs and lipase.      LFTs show slightly elevated alk phos.  Otherwise unremarkable.  Total bilirubin normal.  Lipase normal at 205.      Right upper quadrant ultrasound shows an absent gallbladder.  Common bile duct normal at 0.3 cm.      Symptoms improved in emergency department.  Stable for discharge with outpatient follow-up.  [place]   Emergency Department Treatment Report                Patient: [name] [name]  Age: 65 y.o.  Sex: female          Date of Birth: May 07, 1956  Admit Date: 11/06/2020  PCP: Tora Kindred, MD     MRN: [id number]   CSN: [id number]   Serita Sheller, MD         Room: OJ50/KX38  Time Dictated: 8:28 AM  [facility] As[facility]           Chief Complaint         Chief Complaint       Patient presents with        ?  Chest Pain             History of Present Illness        This is a 65 y.o. 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 Notes that eating/drinking and  laying down makes it worse. Endorses intermittent fever of 100.5 overnight and did not take anything. Notes that 4-6 weeks ago, the only notable cause would be restarting methotrexate for  her RA. She ended up stopping it because her daughter thought the  methotrexate caused the cancer prior.       Patient was seen on 09/13/2020 for similar complaint. Pt had an EGD done by Dr. Lu Duffel on 10/09/20 that showed hiatal hernia and bile reflux gastritis, was started on sulcrafate.        Review of Systems        Review of Systems    Constitutional: Positive for fever. Negative for chills and weight loss.    HENT: Negative for hearing loss.     Respiratory: Negative for cough, hemoptysis and shortness of breath.     Cardiovascular: Positive for chest pain and leg swelling .    Gastrointestinal: Positive for abdominal pain and nausea . Negative for constipation, diarrhea and vomiting.    Genitourinary: Negative for dysuria.    Musculoskeletal: Negative for neck pain.    Skin: Positive for itching. Negative for rash.    Neurological: Negative for dizziness and headaches.    Psychiatric/Behavioral: The patient is not nervous/anxious.             Past Medical/Surgical History          Past Medical History:        Diagnosis  Date         ?  Coronary artery disease       ?  Hyperlipidemia       ?  Indigestion       ?  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  Rheumatoid arthritis [facility])            Past Surgical History:         Procedure  Laterality  Date          ?  HX CORONARY STENT PLACEMENT                 Social History          Social History  Socioeconomic History         ?  Marital status:  MARRIED              Spouse name:  Not on file         ?  Number of children:  Not on file     ?  Years of education:  Not on file     ?  Highest education level:  Not on file       Occupational History        ?  Not on file       Tobacco Use         ?  Smoking status:  Never Smoker     ?  Smokeless tobacco:  Never Used       Vaping Use         ?  Vaping Use:  Never used       Substance and Sexual Activity         ?  Alcohol use:  Yes     ?  Drug use:  No     ?  Sexual activity:  Not on file        Other  Topics  Concern        ?  Not on file       Social History Narrative        ?  Not on file          Social Determinants of Health          Financial Resource Strain:         ?  Difficulty of Paying Living Expenses: Not on file       Food Insecurity:         ?  Worried About Running Out of Food in the Last Year: Not on file     ?  Ran Out of Food in the Last Year: Not on file       Transportation Needs:         ?  Lack of Transportation (Medical): Not on file     ?  Lack of Transportation (Non-Medical): Not on file       Physical Activity:         ?  Days of Exercise per Week: Not on file     ?  Minutes of Exercise per Session: Not on file       Stress:         ?  Feeling of Stress : Not on file       Social Connections:         ?  Frequency of Communication with Friends and Family: Not on file     ?  Frequency of Social Gatherings with Friends and Family: Not on file     ?  Attends Religious Services: Not on file     ?  Active Member of Clubs or Organizations: Not on file     ?  Attends Archivist Meetings: Not on file     ?  Marital Status: Not on file       Intimate Partner Violence:         ?  Fear of Current or Ex-Partner: Not on file     ?  Emotionally Abused: Not on file     ?  Physically Abused: Not on file     ?  Sexually Abused: Not on file  Housing Stability:         ?  Unable to Pay for Housing in the Last Year: Not on file     ?  Number of Places Lived in the Last Year: Not on file        ?  Unstable Housing in the Last Year: Not on file          Family History          Family History         Problem  Relation  Age of Onset          ?  No Known Problems  Mother            ?  No Known Problems  Father               Current Medications          Cannot display prior to admission medications because the patient has not been admitted in this contact.             Allergies          Allergies        Allergen  Reactions         ?  Fentanyl  Anaphylaxis     ?  Morphine  Hives and Itching     ?   Penicillins  Rash     ?  Codeine  Rash     ?  Compazine [Prochlorperazine]  Other (comments)             Jerking movements             Physical Exam          ED Triage Vitals [11/06/20 0827]     Enc Vitals Group           BP  (!) 130/91        Pulse (Heart Rate)  83        Resp Rate  20        Temp  98.7 F (37.1 C)        Temp src          O2 Sat (%)  100 %        Weight          Height          Head Circumference          Peak Flow          Pain Score          Pain Loc          Pain Edu?             Excl. in [place]?             Physical Exam   Vitals and nursing note reviewed.   Constitutional:        Appearance: Normal appearance. She is ill-appearing.   HENT:       Head: Normocephalic and atraumatic.   Eyes :       Extraocular Movements: Extraocular movements intact.      Conjunctiva/sclera: Conjunctivae normal.   Cardiovascular:       Rate and Rhythm: Normal rate and regular rhythm.      Heart sounds: Normal heart sounds. No murmur heard.        Pulmonary:       Effort: Pulmonary effort is normal. No respiratory distress.      Breath  sounds: Normal breath sounds.   Chest:       Comments: Mediport in RU chest with no surrounding erythema.  Abdominal:      Palpations: Abdomen is soft.      Tenderness: There is  abdominal tenderness.     Musculoskeletal:          General: Normal range of motion.      Cervical back: Normal range of motion.    Skin:      General: Skin is warm and dry.   Neurological :       General: No focal deficit present.      Mental Status: She is alert.    Psychiatric:         Mood and Affect: Mood normal.                 Diagnostic Studies        Lab:      Labs Reviewed       CBC WITH AUTOMATED DIFF - Abnormal; Notable for the following components:            Result  Value            MPV  11.2 (*)         RDW-SD  48.3 (*)         NEUTROPHILS  76.1 (*)         LYMPHOCYTES  13.9 (*)            All other components within normal limits       METABOLIC PANEL, BASIC - Abnormal; Notable for the  following components:            Chloride  108 (*)            All other components within normal limits       HEPATIC FUNCTION PANEL - Abnormal; Notable for the following components:            Alk. phosphatase  142 (*)            All other components within normal limits       TROPONIN-HIGH SENSITIVITY       LIPASE           No results found.        Results for orders placed or performed during the hospital encounter of 11/06/20     XR CHEST PA LAT          Narrative          Chest      Indication: Chest pain.   Comparison: 09/13/2020      Findings:      Frontal and lateral views of the chest demonstrate that the cardiac silhouette   is not enlarged.  There is no focal infiltrate, pleural effusion, vascular   congestion, or pneumothorax.  Right Mediport with tip overlying the right   atrium.             Impression       Impression:       No acute cardiopulmonary disease.          [place] RUQ          Narrative       RIGHT UPPER QUADRANT ULTRASOUND.      INDICATION: Abdominal pain.   COMPARISON: No relevant studies.      TECHNIQUE: The examination was performed using grayscale and color Doppler.      FINDINGS:  The liver is diffusely echogenic. It measures 16.9 cm in longitudinal dimension.      The patient is status post cholecystectomy.. Sonographic Murphy sign is   negative. The CBD measures 0.3 cm at the porta hepatis.      The visualized portions of the pancreas, proximal aorta, and proximal IVC are   unremarkable.      The right kidney measures 11.1 cm in longitudinal dimension. There is no   hydronephrosis or nephrolithiasis.          Impression       IMPRESSION:      No acute findings          CBC WITH AUTOMATED DIFF         Result  Value  Ref Range            WBC  6.1  4.0 - 11.0 1000/mm3       RBC  4.49  3.60 - 5.20 M/uL       HGB  14.5  13.0 - 17.2 gm/dl       HCT  43.3  37.0 - 50.0 %       MCV  96.4  80.0 - 98.0 fL       MCH  32.3  25.4 - 34.6 pg       MCHC  33.5  30.0 - 36.0 gm/dl       PLATELET  209  140  - 450 1000/mm3       MPV  11.2 (H)  6.0 - 10.0 fL       RDW-SD  48.3 (H)  36.4 - 46.3         NRBC  0  0 - 0         IMMATURE GRANULOCYTES  0.3  0.0 - 3.0 %       NEUTROPHILS  76.1 (H)  34 - 64 %       LYMPHOCYTES  13.9 (L)  28 - 48 %       MONOCYTES  7.9  1 - 13 %       EOSINOPHILS  1.3  0 - 5 %       BASOPHILS  0.5  0 - 3 %       METABOLIC PANEL, BASIC         Result  Value  Ref Range            Sodium  139  136 - 145 mEq/L       Potassium  4.0  3.5 - 5.1 mEq/L       Chloride  108 (H)  98 - 107 mEq/L       CO2  24  21 - 32 mEq/L       Glucose  100  74 - 106 mg/dl       BUN  16  7 - 25 mg/dl       Creatinine  0.8  0.6 - 1.3 mg/dl       GFR est AA  >60.0          GFR est non-AA  >60          Calcium  8.9  8.5 - 10.1 mg/dl       Anion gap  6  5 - 15 mmol/L       TROPONIN-HIGH SENSITIVITY         Result  Value  Ref Range  Troponin-High Sensitivity  16  0 - 59 ng/L       HEPATIC FUNCTION PANEL         Result  Value  Ref Range            AST (SGOT)  19  15 - 37 U/L       ALT (SGPT)  33  12 - 78 U/L       Alk. phosphatase  142 (H)  45 - 117 U/L       Bilirubin, total  0.4  0.2 - 1.0 mg/dl       Protein, total  6.9  6.4 - 8.2 gm/dl       Albumin  3.8  3.4 - 5.0 gm/dl       Bilirubin, direct  0.1  0.0 - 0.2 mg/dl       LIPASE         Result  Value  Ref Range            Lipase  205  73 - 393 U/L       EKG, 12 LEAD, INITIAL         Result  Value  Ref Range            Ventricular Rate  87  BPM       Atrial Rate  87  BPM       P-R Interval  168  ms       QRS Duration  74  ms       Q-T Interval  368  ms       QTC Calculation (Bezet)  442  ms       Calculated P Axis  57  degrees       Calculated R Axis  58  degrees       Calculated T Axis  63  degrees       Diagnosis                 Normal sinus rhythm   Septal infarct (cited on or before 13-Sep-2020)   Abnormal ECG   When compared with ECG of 13-Sep-2020 10:38,   premature atrial complexes are no longer present   Confirmed by Massie Maroon, M.D., Synetta Shadow [phone]) on 11/06/2020  11:39:18 AM            EKG reviewed with Dr. Meda Coffee, interpretation by ED attending = NSR rate 87 , no ST-T wave changes to suggest acute ischemia.        Medical Decision Making/ ED Course        Chief complaint: Chest pain      65 year old female with a history of CAD and high cholesterol who presents with chest pain      Initial consideration includes ACS, dysrythmias, PE, aortic dissection, PTX, GERD, MSK      Centralized Chest Pain worse with PO intake likely worsening gastritis    - EKG, troponin, CBC, BMP, lipase, CXR - unremarkable   - alk phos - mildly elevated, obtained RUQ [place] - no acute findings    - will give 1 dose of zofran, mylanta, and sulcrafate with sx improvement   - discharge with follow up with GI and oncology follow up            Medications       aluminum & magnesium hydroxide-simethicone (MYLANTA II) oral suspension 30 mL (has no administration in time range)     sodium chloride 0.9 % bolus infusion  1,000 mL (1,000 mL IntraVENous New Bag 11/06/20 0952)     ondansetron (ZOFRAN) injection 4 mg (4 mg IntraVENous Given 11/06/20 0952)       sucralfate (CARAFATE) 100 mg/mL oral suspension 1 g (1 g Oral Given 11/06/20 8469)             Final Diagnosis                 ICD-10-CM  ICD-9-CM          1.  Gastritis and duodenitis   K29.90  535.50          2.  Other chest pain   R07.89  786.59             Disposition        Disposition and plan   Patient was dischartged home in stable condition with discharge instructions on the same.      Return to the ER if condition worsens or new symptoms develop.   Follow up with primary care as discussed.      The patient was personally evaluated by myself and NELSON, Ilene Qua, MD who agrees with the above assessment and plan.        Current Discharge Medication List                  Dragon medical dictation software was used for portions of this report. Unintended errors may occur.       Wallie Renshaw, PA   November 06, 2020      My signature above authenticates this  document and my orders, the final     diagnosis (es), discharge prescription (s), and instructions in the Epic     record.   If you have any questions please contact [phone].       Nursing notes have been reviewed by the physician/ advanced practice     Clinician.

## 2020-11-06 NOTE — Progress Notes (Signed)
Rounded on Pt, Pt sitting comfortably in chair. No further needs at this time. RN notified.

## 2020-11-06 NOTE — ED Notes (Signed)
Went over discharge instructions with patient.  Patient given opportunity to ask questions.  Patient verbalized understanding with all instructions.

## 2021-01-17 ENCOUNTER — Inpatient Hospital Stay: Admit: 2021-01-17 | Payer: TRICARE (CHAMPUS) | Primary: Internal Medicine

## 2021-01-17 ENCOUNTER — Encounter

## 2021-01-17 DIAGNOSIS — M0609 Rheumatoid arthritis without rheumatoid factor, multiple sites: Secondary | ICD-10-CM

## 2021-01-21 ENCOUNTER — Encounter

## 2021-01-22 ENCOUNTER — Inpatient Hospital Stay: Admit: 2021-01-22 | Discharge: 2021-01-22 | Payer: BLUE CROSS/BLUE SHIELD | Primary: Internal Medicine

## 2021-01-22 LAB — PTT: aPTT: 26.2 seconds (ref 25.1–36.5)

## 2021-01-22 LAB — PROTHROMBIN TIME + INR
INR: 1 (ref 0.1–1.1)
Prothrombin time: 12.2 seconds (ref 10.2–12.9)

## 2021-01-22 LAB — PROTIME-INR
INR: 1 (ref 0.1–1.1)
Protime: 12.2 seconds (ref 10.2–12.9)

## 2021-01-22 LAB — APTT: aPTT: 26.2 seconds (ref 25.1–36.5)

## 2021-01-23 ENCOUNTER — Inpatient Hospital Stay
Admit: 2021-01-23 | Discharge: 2021-01-23 | Disposition: A | Discharge status: Home / Self Care | Payer: TRICARE (CHAMPUS) | Attending: Emergency Medicine

## 2021-01-23 ENCOUNTER — Inpatient Hospital Stay
Admit: 2021-01-23 | Payer: BLUE CROSS/BLUE SHIELD | Attending: Student in an Organized Health Care Education/Training Program | Primary: Internal Medicine

## 2021-01-23 DIAGNOSIS — L299 Pruritus, unspecified: Secondary | ICD-10-CM

## 2021-01-23 LAB — EKG, 12 LEAD, INITIAL
Atrial Rate: 76 {beats}/min
Calculated P Axis: 43 degrees
Calculated R Axis: 39 degrees
Calculated T Axis: 55 degrees
Diagnosis: NORMAL
P-R Interval: 184 ms
Q-T Interval: 406 ms
QRS Duration: 78 ms
QTC Calculation (Bezet): 456 ms
Ventricular Rate: 76 {beats}/min

## 2021-01-23 LAB — METABOLIC PANEL, BASIC
Anion gap: 10 mmol/L (ref 5–15)
BUN: 9 mg/dl (ref 7–25)
CO2: 22 mEq/L (ref 21–32)
Calcium: 9.5 mg/dl (ref 8.5–10.1)
Chloride: 109 mEq/L — ABNORMAL HIGH (ref 98–107)
Creatinine: 0.9 mg/dl (ref 0.6–1.3)
GFR est AA: >60
GFR est non-AA: >60
Glucose: 69 mg/dl — ABNORMAL LOW (ref 74–106)
Potassium: 3.5 mEq/L (ref 3.5–5.1)
Sodium: 141 mEq/L (ref 136–145)

## 2021-01-23 LAB — CBC W/O DIFF
HCT: 47.5 % (ref 37.0–50.0)
HGB: 15.6 gm/dl (ref 13.0–17.2)
MCH: 31.8 pg (ref 25.4–34.6)
MCHC: 32.8 gm/dl (ref 30.0–36.0)
MCV: 96.9 fL (ref 80.0–98.0)
MPV: 13.1 fL — ABNORMAL HIGH (ref 6.0–10.0)
PLATELET: 182 10*3/uL (ref 140–450)
RBC: 4.9 M/uL (ref 3.60–5.20)
RDW-SD: 47.9 — ABNORMAL HIGH (ref 36.4–46.3)
WBC: 6.6 10*3/uL (ref 4.0–11.0)

## 2021-01-23 LAB — BASIC METABOLIC PANEL
Anion Gap: 10 mmol/L (ref 5–15)
BUN: 9 mg/dl (ref 7–25)
CO2: 22 mEq/L (ref 21–32)
Calcium: 9.5 mg/dl (ref 8.5–10.1)
Chloride: 109 mEq/L — ABNORMAL HIGH (ref 98–107)
Creatinine: 0.9 mg/dl (ref 0.6–1.3)
GFR African American: >60
Glucose: 69 mg/dl — ABNORMAL LOW (ref 74–106)
Potassium: 3.5 mEq/L (ref 3.5–5.1)
Sodium: 141 mEq/L (ref 136–145)
eGFR NON-AA: >60

## 2021-01-23 LAB — CBC
Hematocrit: 47.5 % (ref 37.0–50.0)
Hemoglobin: 15.6 gm/dl (ref 13.0–17.2)
MCH: 31.8 pg (ref 25.4–34.6)
MCHC: 32.8 gm/dl (ref 30.0–36.0)
MCV: 96.9 fL (ref 80.0–98.0)
MPV: 13.1 fL — ABNORMAL HIGH (ref 6.0–10.0)
Platelets: 182 10*3/uL (ref 140–450)
RBC: 4.9 M/uL (ref 3.60–5.20)
RDW-SD: 47.9 — ABNORMAL HIGH (ref 36.4–46.3)
WBC: 6.6 10*3/uL (ref 4.0–11.0)

## 2021-01-23 LAB — EKG 12-LEAD
Atrial Rate: 76 {beats}/min
Diagnosis: NORMAL
P Axis: 43 degrees
P-R Interval: 184 ms
Q-T Interval: 406 ms
QRS Duration: 78 ms
QTc Calculation (Bazett): 456 ms
R Axis: 39 degrees
T Axis: 55 degrees
Ventricular Rate: 76 {beats}/min

## 2021-01-23 MED ORDER — METHYLPREDNISOLONE (PF) 125 MG/2 ML IJ SOLR
125 mg/2 mL | Freq: Once | INTRAMUSCULAR | Status: AC
Start: 2021-01-23 — End: 2021-01-23
  Administered 2021-01-23: 14:00:00 via INTRAVENOUS

## 2021-01-23 MED ORDER — DIPHENHYDRAMINE-ZINC ACETATE 2 %-0.1 % TOPICAL CREAM
CUTANEOUS | Status: AC
Start: 2021-01-23 — End: 2021-01-23
  Administered 2021-01-23: 17:00:00 via TOPICAL

## 2021-01-23 MED ORDER — DIPHENHYDRAMINE HCL 50 MG/ML IJ SOLN
50 mg/mL | Freq: Once | INTRAMUSCULAR | Status: AC | PRN
Start: 2021-01-23 — End: 2021-01-23
  Administered 2021-01-23: 14:00:00 via INTRAVENOUS

## 2021-01-23 MED ORDER — LORAZEPAM 2 MG/ML IJ SOLN
2 mg/mL | Freq: Once | INTRAMUSCULAR | Status: DC
Start: 2021-01-23 — End: 2021-01-23

## 2021-01-23 MED ORDER — HYDROXYZINE 25 MG TAB
25 mg | ORAL | Status: AC
Start: 2021-01-23 — End: 2021-01-23
  Administered 2021-01-23: 18:00:00 via ORAL

## 2021-01-23 MED ORDER — HYDROMORPHONE (PF) 2 MG/ML IJ SOLN
2 mg/mL | INTRAMUSCULAR | Status: DC | PRN
Start: 2021-01-23 — End: 2021-01-23
  Administered 2021-01-23 (×2): via INTRAVENOUS

## 2021-01-23 MED ORDER — ACETAMINOPHEN 325 MG TABLET
325 mg | Freq: Once | ORAL | Status: DC | PRN
Start: 2021-01-23 — End: 2021-01-23

## 2021-01-23 MED ORDER — CLINDAMYCIN IN D5W 600 MG/50 ML IV PIGGY BACK
600 mg/50 mL | Freq: Three times a day (TID) | INTRAVENOUS | Status: DC
Start: 2021-01-23 — End: 2021-01-23

## 2021-01-23 MED ORDER — FLUMAZENIL 0.1 MG/ML IV SOLN
0.1 mg/mL | INTRAVENOUS | Status: DC | PRN
Start: 2021-01-23 — End: 2021-01-23

## 2021-01-23 MED ORDER — DIPHENHYDRAMINE HCL 50 MG/ML IJ SOLN
50 mg/mL | INTRAMUSCULAR | Status: AC
Start: 2021-01-23 — End: 2021-01-23
  Administered 2021-01-23: 16:00:00 via INTRAVENOUS

## 2021-01-23 MED ORDER — NALOXONE 0.4 MG/ML INJECTION
0.4 mg/mL | INTRAMUSCULAR | Status: DC | PRN
Start: 2021-01-23 — End: 2021-01-23

## 2021-01-23 MED ORDER — SODIUM CHLORIDE 0.9 % IV
Freq: Once | INTRAVENOUS | Status: AC
Start: 2021-01-23 — End: 2021-01-23
  Administered 2021-01-23: 13:00:00

## 2021-01-23 MED ORDER — DIPHENHYDRAMINE HCL 50 MG/ML IJ SOLN
50 mg/mL | INTRAMUSCULAR | Status: AC
Start: 2021-01-23 — End: 2021-01-23

## 2021-01-23 MED ORDER — BUPIVACAINE (PF) 0.5 % (5 MG/ML) IJ SOLN
0.5 % (5 mg/mL) | INTRAMUSCULAR | Status: DC | PRN
Start: 2021-01-23 — End: 2021-01-23
  Administered 2021-01-23: 14:00:00 via SUBCUTANEOUS

## 2021-01-23 MED ORDER — LORAZEPAM 2 MG/ML IJ SOLN
2 mg/mL | INTRAMUSCULAR | Status: DC
Start: 2021-01-23 — End: 2021-01-23
  Administered 2021-01-23: 17:00:00

## 2021-01-23 MED ORDER — OCTYL 2-CYANOACRYLATE TOPICAL LIQUID
Freq: Once | CUTANEOUS | Status: AC
Start: 2021-01-23 — End: 2021-01-23
  Administered 2021-01-23: 13:00:00 via TOPICAL

## 2021-01-23 MED ORDER — MIDAZOLAM 1 MG/ML IJ SOLN
1 mg/mL | INTRAMUSCULAR | Status: DC | PRN
Start: 2021-01-23 — End: 2021-01-23
  Administered 2021-01-23 (×3): via INTRAVENOUS

## 2021-01-23 MED FILL — SODIUM CHLORIDE 0.9 % IV: INTRAVENOUS | Qty: 1000

## 2021-01-23 MED FILL — CLINDAMYCIN IN D5W 600 MG/50 ML IV PIGGY BACK: 600 mg/50 mL | INTRAVENOUS | Qty: 50

## 2021-01-23 MED FILL — LORAZEPAM 2 MG/ML IJ SOLN: 2 mg/mL | INTRAMUSCULAR | Qty: 1

## 2021-01-23 MED FILL — HYDROXYZINE 25 MG TAB: 25 mg | ORAL | Qty: 1

## 2021-01-23 MED FILL — MIDAZOLAM 1 MG/ML IJ SOLN: 1 mg/mL | INTRAMUSCULAR | Qty: 4

## 2021-01-23 MED FILL — HYDROMORPHONE 2 MG/ML INJECTION SOLUTION: 2 mg/mL | INTRAMUSCULAR | Qty: 1

## 2021-01-23 MED FILL — DIPHENHYDRAMINE HCL 50 MG/ML IJ SOLN: 50 mg/mL | INTRAMUSCULAR | Qty: 1

## 2021-01-23 MED FILL — SOLU-MEDROL (PF) 125 MG/2 ML SOLUTION FOR INJECTION: 125 mg/2 mL | INTRAMUSCULAR | Qty: 2

## 2021-01-23 MED FILL — ITCH RELIEF 2 %-0.1 % TOPICAL CREAM: CUTANEOUS | Qty: 28

## 2021-01-23 NOTE — Progress Notes (Signed)
Responded to request by radiology holding staff to assess pt for possible allergic reaction. Pt had mediport removal under IR guidance. Was pre-medicated with solumedrol and benadryl prior to procedure d/t significant sensitivity to medications. Per Almyra Free, RN-- pharmacy was contacted about pain medication options given allergy to fentanyl and morphine and ok'd to give Dilaudid. Administered following procedure. Pt then began having severe itching reaction. Skin reddened where pt has been feverishly itching however no welts/ hives noted. No airway involvement--speech clear. Pt oxygenating 98-100%. No evidence of tachycardia. Pt transported to ED room 5 and handoff given to Joaquim Nam. Updated Dr Jacklynn Barnacle to situation.

## 2021-01-23 NOTE — H&P (Signed)
INTERVENTIONAL RADIOLOGY OUTPATIENT HISTORY & PHYSICAL     January 23, 2021       10:09 AM     Indication/symptoms: [name] [name] is a 65 y.o. female who presents for mediport removal.      The medical record and relevant imaging were reviewed. Expected benefits, potential risks, and alternatives to the procedure were discussed with the patient (and/or surrogate decision maker, as applicable) and all questions were answered. Informed consent was obtained.    Current medications:    Current Facility-Administered Medications   Medication Dose Route Frequency Provider Last Rate Last Admin   . 0.9% sodium chloride infusion 1,000 mL  1,000 mL Irrigation ONCE Gregary Signs III, MD       . bupivacine 0.5% (20 ml) with lidocaine epi 1%-1:100,000 (20 ml) ([place])   SubCUTAneous Multiple Gregary Signs III, MD       . octyl 2-cyanoacrylate (DERMABOND) topical liquid liqd 1 Each  1 Each Topical ONCE Gregary Signs III, MD       . clindamycin (CLEOCIN) 600mg  D5W 75mL IVPB (premix)  600 mg Other Q8H Gregary Signs III, MD       . flumazeniL (ROMAZICON) 0.1 mg/mL injection 0.2 mg  0.2 mg IntraVENous Multiple Gregary Signs III, MD       . midazolam (VERSED) injection 0.25-4 mg  0.25-4 mg IntraVENous Multiple Gregary Signs III, MD       . naloxone [facility]) injection 0.4 mg  0.4 mg IntraVENous Multiple Gregary Signs III, MD       . diphenhydrAMINE (BENADRYL) injection 50 mg  50 mg IntraVENous ONCE PRN Gregary Signs III, MD       . methylPREDNISolone (PF) (Solu-MEDROL) injection 125 mg  125 mg IntraVENous ONCE Gregary Signs III, MD       . HYDROmorphone (PF) (DILAUDID) injection 1-2 mg  1-2 mg IntraVENous Q2H PRN Meryl Dare, MD           Allergies:  Allergies   Allergen Reactions   . Fentanyl Anaphylaxis   . Morphine Hives and Itching   . Penicillins Rash   . Codeine Rash   . Compazine [Prochlorperazine] Other (comments)     Jerking movements       Past medical history:  Past Medical  History:   Diagnosis Date   . Coronary artery disease    . Hyperlipidemia    . Indigestion    . 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 Rheumatoid arthritis ([place])        Past surgical history:  Past Surgical History:   Procedure Laterality Date   . HX CORONARY STENT PLACEMENT         Data:    Vital signs:   Visit Vitals  BP (!) 140/73 (BP 1 Location: Right lower arm, BP Patient Position: At rest;Sitting)   Pulse 78   Temp 97 F (36.1 C)   Resp 16   Ht 5\' 5"  (1.651 m)   Wt 105.7 kg (233 lb)   SpO2 99%   Breastfeeding No   BMI 38.77 kg/m   :     Laboratory data:   Metabolic panel:  Lab Results   Component Value Date/Time    Sodium 141 01/22/2021 01:59 PM    Potassium 3.5 01/22/2021 01:59 PM    Chloride 109 (H) 01/22/2021 01:59 PM    CO2 22 01/22/2021 01:59 PM  BUN 9 01/22/2021 01:59 PM    Creatinine 0.9 01/22/2021 01:59 PM    Glucose 69 (L) 01/22/2021 01:59 PM     Complete blood count:  Lab Results   Component Value Date/Time    WBC 6.6 01/22/2021 01:59 PM    HGB 15.6 01/22/2021 01:59 PM    HCT 47.5 01/22/2021 01:59 PM    PLATELET 182 01/22/2021 01:59 PM     Coagulation parameters:  Lab Results   Component Value Date/Time    Prothrombin time 12.2 01/22/2021 01:59 PM    INR 1.0 01/22/2021 01:59 PM    aPTT 26.2 01/22/2021 01:59 PM          Physical examination:   General: Alert and oriented; no acute distress   Heart:     Regular rate/rhythm   Lungs:   Clear to ausculation bilaterally    The patient is an appropriate candidate to undergo the procedure and, if necessary, to receive moderate sedation/analgesia.    Meryl Dare, MD

## 2021-01-23 NOTE — ED Provider Notes (Signed)
[facility]  Emergency Department Treatment Report    Patient: [name] [name] Age: 65 y.o. Sex: female    Date of Birth: 06/04/1956 Admit Date: 01/23/2021 PCP: Tora Kindred, MD   MRN: [id number]  CSN: [id number]     Room: ER05/ER05 Time Dictated: 1:09 PM      Dragon medical dictation software was used for portions of this report.  Unintended transcription errors may occur.    Chief Complaint   Allergic reaction  History of Present Illness   65 y.o. female with a known history of opiate allergies was given opiates prior to her Mediport removal.  Was pretreated but nonetheless had a reaction.  She was given the Solu-Medrol pretreatment, Benadryl before and after.  And has had persistent itching since the incident.  Given the persistent itching was brought to the ER from interventional radiology.  Denies shortness of breath, denies any swelling to her tongue or throat.    Review of Systems   Review of Systems   Constitutional: Negative for fever.   HENT: Negative for congestion and sore throat.    Respiratory: Negative for cough.    Cardiovascular: Negative for chest pain.   Gastrointestinal: Negative for abdominal pain, nausea and vomiting.   Skin: Positive for itching and rash.   Neurological: Negative for headaches.       Past Medical/Surgical History     Past Medical History:   Diagnosis Date   . Coronary artery disease    . Hyperlipidemia    . Indigestion    . 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 Rheumatoid arthritis [facility])      Past Surgical History:   Procedure Laterality Date   . HX CORONARY STENT PLACEMENT     . IR REMOVE TUNL CVAD W PORT/PUMP  01/23/2021       Social History     Social History     Socioeconomic History   . Marital status: MARRIED   Tobacco Use   . Smoking status: Never Smoker   . Smokeless tobacco: Never Used   Vaping Use   . Vaping Use: Never used   Substance and Sexual Activity   . Alcohol use: Yes   . Drug use: No       Family History     Family History   Problem  Relation Age of Onset   . No Known Problems Mother    . No Known Problems Father        Current Medications     Current Outpatient Medications   Medication Sig Dispense Refill   . levothyroxine (Synthroid) 137 mcg tablet Take  by mouth Daily (before breakfast).     . clopidogreL (Plavix) 75 mg tab Take 75 mg by mouth daily.     . sucralfate (Carafate) 100 mg/mL suspension Take 10 mL by mouth four (4) times daily. 420 mL 0   . thyroid, Pork, (ARMOUR) 90 mg tablet Take  by mouth daily. (Patient not taking: Reported on 10/09/2020)     . ergocalciferol (VITAMIN D2) 50,000 unit capsule Take 50,000 Units by mouth every seven (7) days.     . traMADol (ULTRAM) 50 mg tablet Take 50 mg by mouth every six (6) hours as needed for Pain. (Patient not taking: Reported on 11/06/2020)     . hydroxychloroquine (PLAQUENIL) 200 mg tablet Take 200 mg by mouth two (2) times a day. (Patient not taking: Reported on 01/23/2021)     .  folic acid (FOLVITE) 1 mg tablet Take  by mouth daily. (Patient not taking: Reported on 01/23/2021)     . simvastatin (ZOCOR) 20 mg tablet Take  by mouth nightly. (Patient not taking: Reported on 01/23/2021)     . pramipexole (MIRAPEX) 1 mg tablet Take 1 Tab by mouth nightly. 15 Tab 0       Allergies     Allergies   Allergen Reactions   . Fentanyl Anaphylaxis   . Morphine Hives and Itching   . Penicillins Rash   . Codeine Rash   . Compazine [Prochlorperazine] Other (comments)     Jerking movements       Physical Exam     Visit Vitals  BP 109/62   Pulse 71   Temp 97.1 F (36.2 C)   Resp 16   SpO2 91%     Physical Exam  Constitutional:       Appearance: She is obese.      Comments: Shivering because she is covered in the soaking cold wet rag   HENT:      Head: Normocephalic and atraumatic.      Mouth/Throat:      Mouth: Mucous membranes are moist.   Eyes:      Conjunctiva/sclera: Conjunctivae normal.   Cardiovascular:      Rate and Rhythm: Normal rate and regular rhythm.      Pulses: Normal pulses.      Heart sounds:  Normal heart sounds.   Pulmonary:      Effort: Pulmonary effort is normal.      Breath sounds: Normal breath sounds.   Abdominal:      General: Abdomen is flat.   Musculoskeletal:         General: No swelling.   Skin:     General: Skin is warm and dry.      Findings: Erythema present.      Comments: Mild diffuse erythematous allergic type rash, with scratches from where she is itching.   Neurological:      General: No focal deficit present.      Mental Status: She is alert.         Impression and Management Plan   65 year old female here today for evaluation of persistent itching after being giving opiates in the setting of known opiate allergy.  We will treat with Atarax, given that she is on a QT prolonging medications we will do a screening EKG prior to administration of Atarax.    Diagnostic Studies   Lab:   No results found for this or any previous visit (from the past 12 hour(s)).    Imaging:    No results found.      Medical Decision Making/ED Course     ED Course as of 01/25/21 0624   Wed Jan 23, 2021   1412 Noted on patient, prior to falling asleep she indicated to the nurse that she was feeling a good bit better.  Has been requesting that her nap for a few minutes before he wake her up.  I do feel that she is steadily improving, probably stable discharge to home when she wakes up early reassess. [JR]      ED Course User Index  [JR] Mora Bellman, MD       Patient has remained stable, itching has markedly improved.  Will discharge to home with advised primary care follow-up.    Final Diagnosis       ICD-10-CM ICD-9-CM   1. Allergic  reaction, initial encounter  T78.40XA 995.3     Disposition   Discharge to home    Mora Bellman, MD  January 25, 2021    My signature above authenticates this document and my orders, the final    diagnosis (es), discharge prescription (s), and instructions in the Epic    record.  If you have any questions please contact [phone].

## 2021-01-23 NOTE — Procedures (Signed)
VASCULAR & INTERVENTIONAL RADIOLOGY  MEDIPORT REMOVAL     Informed consent obtained.   Right MediPort removed.    Full radiology report to follow.     Gregary Signs MD  Director Interventional Radiology  January 23, 2021  10:36 AM

## 2021-01-23 NOTE — ED Notes (Signed)
3:01 PM  01/23/21     Discharge instructions given to [name] [name] (name) with verbalization of understanding. Patient accompanied by patient and spouse.  Patient discharged with the following prescriptions   Current Discharge Medication List       . Patient discharged to Home (destination).      Leeroy Bock, RN

## 2021-01-23 NOTE — ED Notes (Signed)
Patient states her itching has resolved and she is feeling much better after receiving medication.

## 2021-01-23 NOTE — ED Notes (Signed)
Pt brought over from outpatient radiology after having allergic reaction.  Pt was having medi port removed and received benadryl and solu medrol prior to procedure and versed/dilaudid during procedure.  Pt has itching all over her body with redness noted from scratching.

## 2021-08-13 ENCOUNTER — Encounter

## 2021-08-15 ENCOUNTER — Encounter

## 2021-08-15 ENCOUNTER — Inpatient Hospital Stay: Admit: 2021-08-15 | Payer: BLUE CROSS/BLUE SHIELD | Attending: Internal Medicine | Primary: Internal Medicine

## 2021-08-15 DIAGNOSIS — N644 Mastodynia: Secondary | ICD-10-CM

## 2024-02-08 IMAGING — US US breast RT [ID]
1 series · 14 of 16 positions shown · non-contrast
Comparison: none

Procedure(s): US breast RT [id number]

Exam: Limited right breast ultrasound.
Indications: Right breast/axillary mass.
TECHNIQUE: Routine limited right breast ultrasound with
evaluation of the area
of interest and axilla.

[Series 1: us breast right (id) · 14 of 16 slices shown]
[im 1/16]
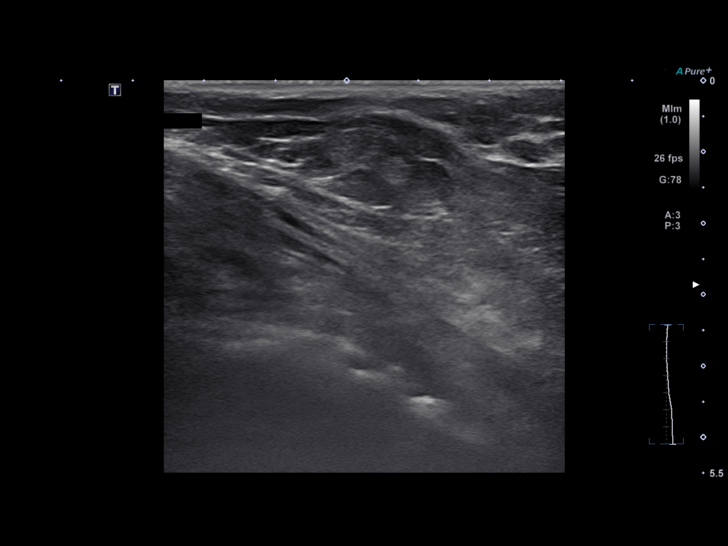
[im 2/16]
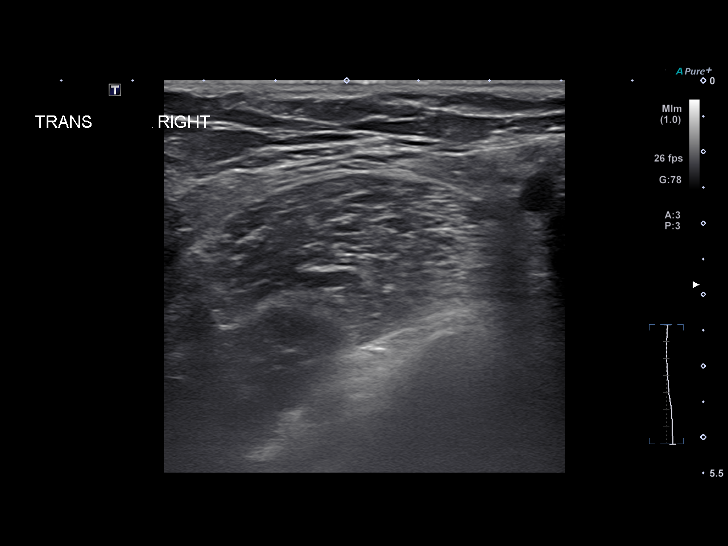
[im 3/16]
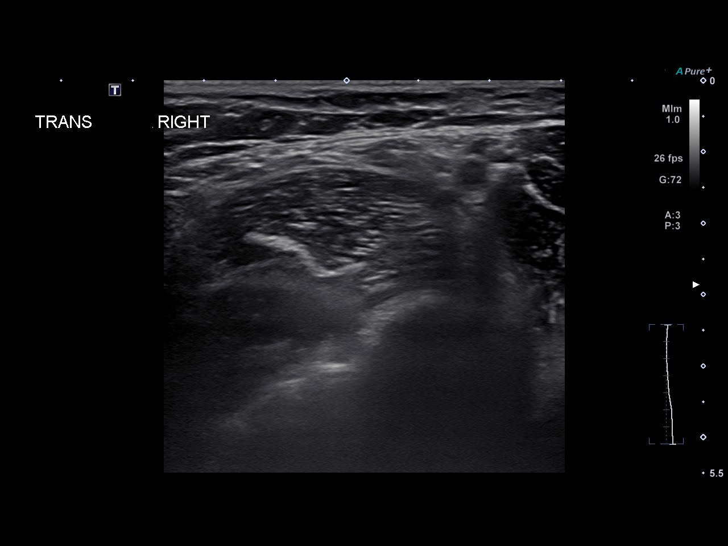
[im 5/16]
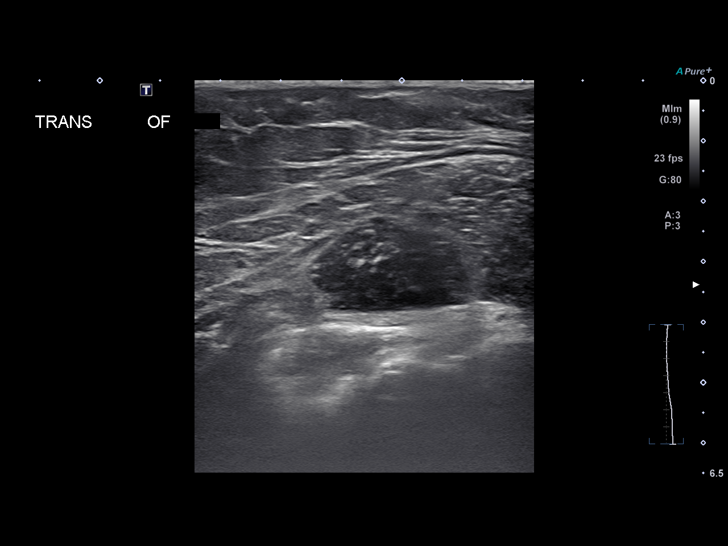
[im 6/16]
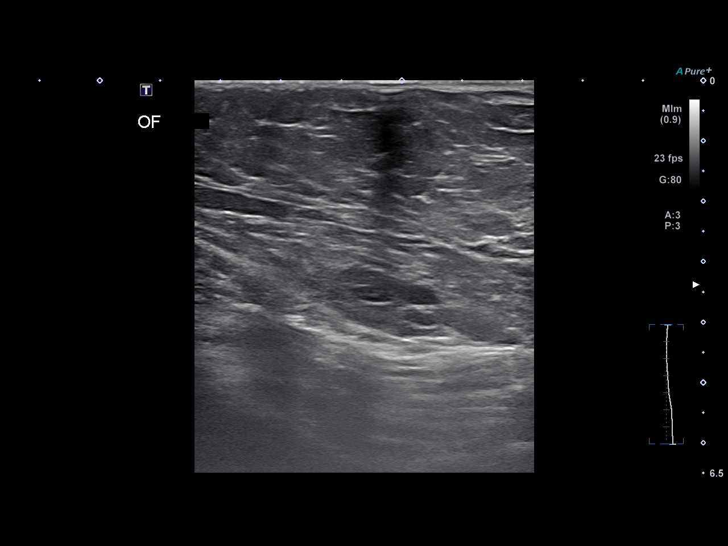
[im 7/16]
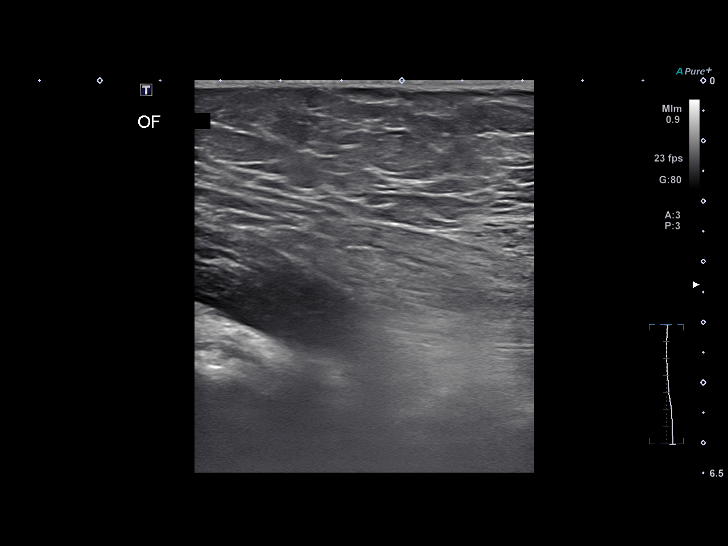
[im 8/16]
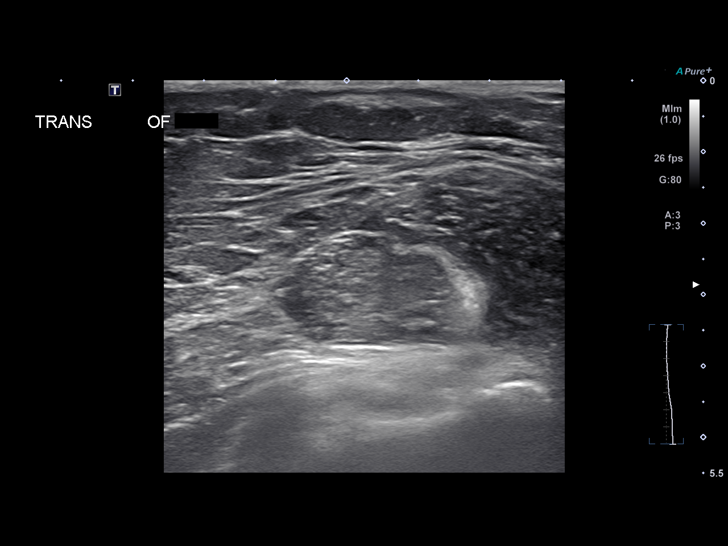
[im 9/16]
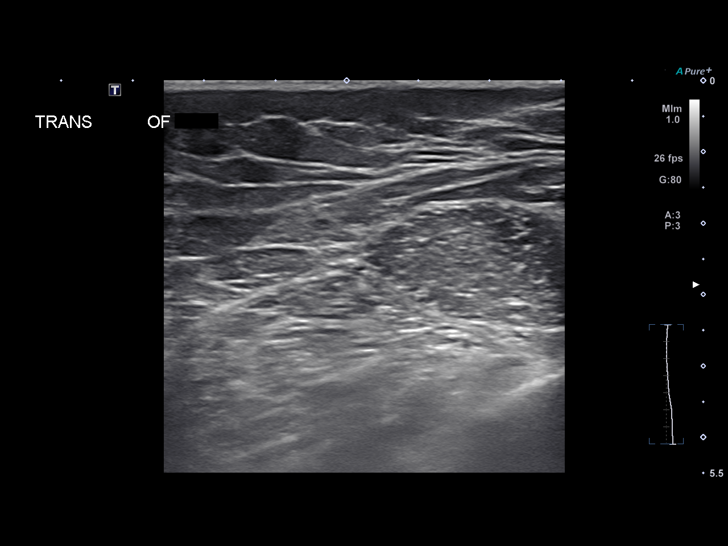
[im 10/16]
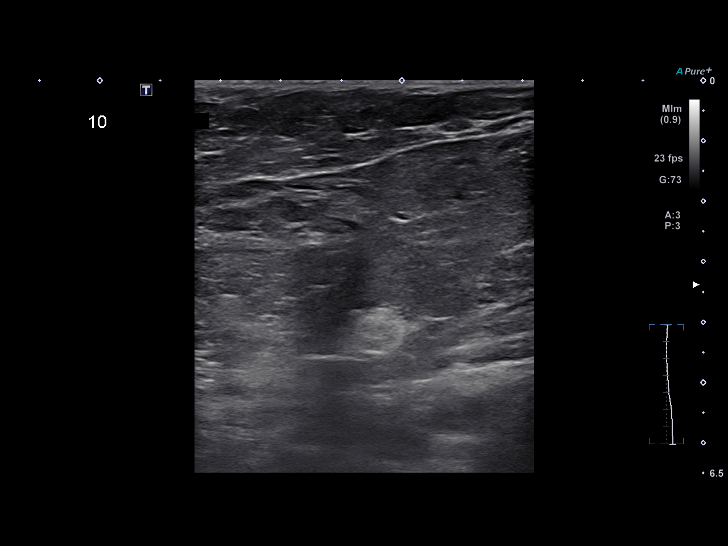
[im 11/16]
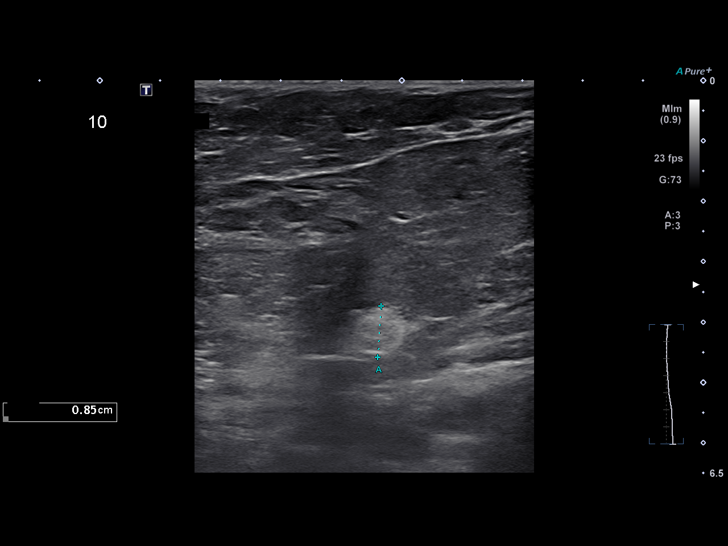
[im 13/16]
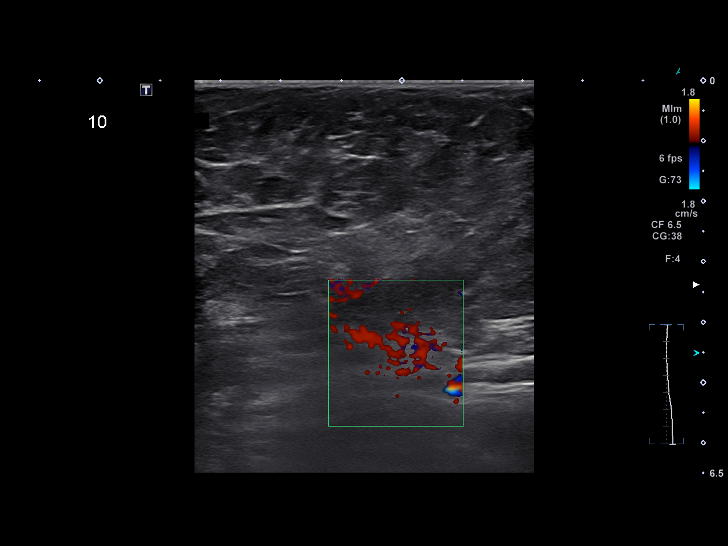
[im 14/16]
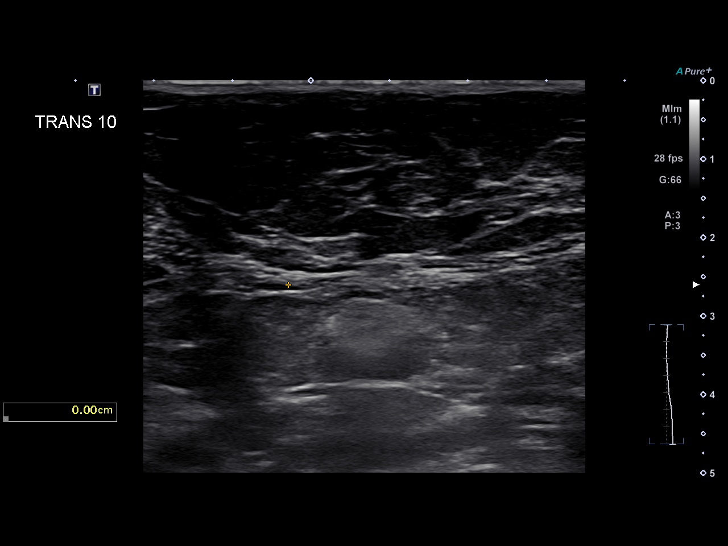
[im 15/16]
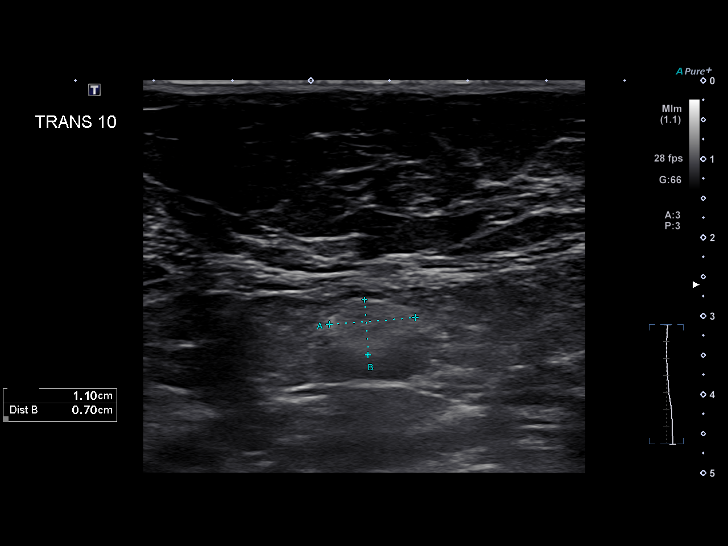
[im 16/16]
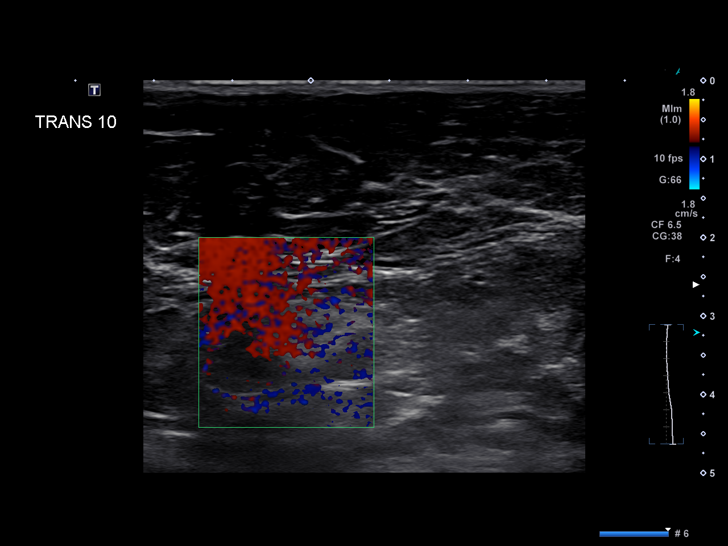

[14 of 16 positions shown; findings below may reference images not displayed]

FINDINGS: At the 11 o'clock position 10 cm from the nipple there is an
echogenic mass most
compatible with a small lipoma or intraparenchymal lymph
node measuring 9 x 11 x
7 mm.

The axilla is unremarkable.
IMPRESSION: Unremarkable examination of the axilla.

Small benign-appearing intraparenchymal lymph node or lipoma
at the 11 o'clock
position 10 cm from the nipple. A bilateral diagnostic
mammogram follow-up
recommended.

BI RADS 0-Incomplete-  Needs additional imaging evaluation
and/or prior
mammograms for comparison.
